# Patient Record
Sex: Female | Born: 1995 | Race: Asian | Marital: Married | Smoking: Never smoker
Health system: Southern US, Community
[De-identification: ages and names within clinical notes are randomized; demographics above are authoritative.]

## PROBLEM LIST (undated history)

## (undated) ENCOUNTER — Ambulatory Visit: Admission: EM | Source: Home / Self Care

---

## 2015-08-10 ENCOUNTER — Ambulatory Visit (INDEPENDENT_AMBULATORY_CARE_PROVIDER_SITE_OTHER): Payer: BLUE CROSS/BLUE SHIELD

## 2015-08-10 ENCOUNTER — Ambulatory Visit (INDEPENDENT_AMBULATORY_CARE_PROVIDER_SITE_OTHER): Payer: BLUE CROSS/BLUE SHIELD | Admitting: Emergency Medicine

## 2015-08-10 ENCOUNTER — Encounter (HOSPITAL_BASED_OUTPATIENT_CLINIC_OR_DEPARTMENT_OTHER): Payer: Self-pay

## 2015-08-10 ENCOUNTER — Ambulatory Visit (HOSPITAL_BASED_OUTPATIENT_CLINIC_OR_DEPARTMENT_OTHER)
Admission: RE | Admit: 2015-08-10 | Discharge: 2015-08-10 | Disposition: A | Discharge status: Home / Self Care | Payer: BLUE CROSS/BLUE SHIELD | Source: Ambulatory Visit | Attending: Emergency Medicine | Admitting: Emergency Medicine

## 2015-08-10 VITALS — BP 114/68 | HR 91 | Temp 99.0°F | Resp 16 | Ht 61.0 in | Wt 134.0 lb

## 2015-08-10 DIAGNOSIS — R1310 Dysphagia, unspecified: Secondary | ICD-10-CM

## 2015-08-10 DIAGNOSIS — R109 Unspecified abdominal pain: Secondary | ICD-10-CM | POA: Diagnosis not present

## 2015-08-10 DIAGNOSIS — R1013 Epigastric pain: Secondary | ICD-10-CM

## 2015-08-10 DIAGNOSIS — D72829 Elevated white blood cell count, unspecified: Secondary | ICD-10-CM

## 2015-08-10 LAB — POCT URINALYSIS DIP (MANUAL ENTRY)
BILIRUBIN UA: NEGATIVE
GLUCOSE UA: NEGATIVE
Ketones, POC UA: NEGATIVE
Leukocytes, UA: NEGATIVE
NITRITE UA: NEGATIVE
Protein Ur, POC: NEGATIVE
Spec Grav, UA: 1.025
UROBILINOGEN UA: 1
pH, UA: 7

## 2015-08-10 LAB — COMPLETE METABOLIC PANEL WITH GFR
ALBUMIN: 4.3 g/dL (ref 3.6–5.1)
ALK PHOS: 102 U/L (ref 33–115)
ALT: 17 U/L (ref 6–29)
AST: 20 U/L (ref 10–30)
BILIRUBIN TOTAL: 0.6 mg/dL (ref 0.2–1.2)
BUN: 9 mg/dL (ref 7–25)
CO2: 24 mmol/L (ref 20–31)
CREATININE: 0.52 mg/dL (ref 0.50–1.10)
Calcium: 9.6 mg/dL (ref 8.6–10.2)
Chloride: 103 mmol/L (ref 98–110)
GFR, Est African American: >89 mL/min (ref >=60)
GFR, Est Non African American: >89 mL/min (ref >=60)
Glucose, Bld: 69 mg/dL (ref 65–99)
Potassium: 4.8 mmol/L (ref 3.5–5.3)
Sodium: 138 mmol/L (ref 135–146)
TOTAL PROTEIN: 7.4 g/dL (ref 6.1–8.1)

## 2015-08-10 LAB — POCT CBC
GRANULOCYTE PERCENT: 64 % (ref 37–80)
HCT, POC: 40.5 % (ref 37.7–47.9)
Hemoglobin: 14.4 g/dL (ref 12.2–16.2)
Lymph, poc: 3.2 (ref 0.6–3.4)
MCH: 30 pg (ref 27–31.2)
MCHC: 35.5 g/dL — AB (ref 31.8–35.4)
MCV: 84.4 fL (ref 80–97)
MID (CBC): 0.9 (ref 0–0.9)
MPV: 8.7 fL (ref 0–99.8)
PLATELET COUNT, POC: 268 10*3/uL (ref 142–424)
POC Granulocyte: 7.3 — AB (ref 2–6.9)
POC LYMPH %: 28 % (ref 10–50)
POC MID %: 8 % (ref 0–12)
RBC: 4.79 M/uL (ref 4.04–5.48)
RDW, POC: 13.3 %
WBC: 11.4 10*3/uL — AB (ref 4.6–10.2)

## 2015-08-10 LAB — POCT URINE PREGNANCY: PREG TEST UR: NEGATIVE

## 2015-08-10 MED ORDER — IOPAMIDOL (ISOVUE-300) INJECTION 61%
100.0000 mL | Freq: Once | INTRAVENOUS | Status: AC | PRN
  Administered 2015-08-10: 100 mL via INTRAVENOUS

## 2015-08-10 MED ORDER — OMEPRAZOLE 40 MG PO CPDR: 40.0000 mg | DELAYED_RELEASE_CAPSULE | Freq: Every day | ORAL | Status: DC

## 2015-08-10 NOTE — Progress Notes (Addendum)
By signing my name below, I, Raven Small, attest that this documentation has been prepared under the direction and in the presence of Lesle ChrisSteven Christopher Glasscock, MD.  Electronically Signed: Andrew Auaven Small, ED Scribe. 08/10/2015. 12:14 PM.  Chief Complaint:  Chief Complaint  Patient presents with  . Gastroesophageal Reflux    pt states that when she eats her stomach hurts, and feels like the food is just sitting in one area    HPI: Isabella Townsend is a 20 y.o. female who [facility] complaining of trouble eating for the past 3 days. Pt reports abdominal pain with eating, becoming full quickly and has the sensation that food is stuck in her esophagus, pointing to chest. She has same issue when she lived in [place] about 4[place]5 years ago. She states symptoms went away after having her son.  She has taken tylenol without relief to symptoms but has not tried zantac or other anti acid.  LMP was 1 week ago and last 3-4 days. Pt is on nexplanon. Pt denies weight loss, emesis, and vomiting blood.   Pt is from [place] and does not speak [place]. She had brought her sister in law to translate.    History reviewed. No pertinent past medical history. History reviewed. No pertinent past surgical history. Social History   Social History  . Marital Status: Married    Spouse Name: N/A  . Number of Children: N/A  . Years of Education: N/A   Social History Main Topics  . Smoking status: Never Smoker   . Smokeless tobacco: None  . Alcohol Use: None  . Drug Use: None  . Sexual Activity: Not Asked   Other Topics Concern  . None   Social History Narrative  . None   History reviewed. No pertinent family history. Allergies not on file Prior to Admission medications   Medication Sig Start Date End Date Taking? Authorizing Provider  etonogestrel (NEXPLANON) 68 MG IMPL implant 1 each by Subdermal route once.   Yes Historical Provider, MD     ROS: The patient denies fevers, chills, night sweats, unintentional  weight loss, chest pain, palpitations, wheezing, dyspnea on exertion, nausea, vomiting,  dysuria, hematuria, melena, numbness, weakness, or tingling.   All other systems have been reviewed and were otherwise negative with the exception of those mentioned in the HPI and as above.    PHYSICAL EXAM: Filed Vitals:   08/10/15 1205  BP: 114/68  Pulse: 91  Temp: 99 F (37.2 C)  Resp: 16   Body mass index is 25.33 kg/(m^2).   General: Alert, no acute distress HEENT:  Normocephalic, atraumatic, oropharynx patent. [facility] Cardiovascular:  Regular rate and rhythm, no rubs murmurs or gallops.  No Carotid bruits, radial pulse intact. No pedal edema.  Respiratory: Clear to auscultation bilaterally.  No wheezes, rales, or rhonchi.  No cyanosis, no use of accessory musculature Abdominal: No organomegaly, abdomen is soft, positive bowel sounds.  No masses. Very mild tenderness mid epigastrium. No rebound no guarding.  Musculoskeletal: Gait intact. No edema, tenderness Skin: No rashes. Neurologic: Facial musculature symmetric. Psychiatric: Patient acts appropriately throughout our interaction. Lymphatic: No cervical or submandibular lymphadenopathy   LABS: Results for orders placed or performed in visit on 08/10/15  POCT CBC  Result Value Ref Range   WBC 11.4 (A) 4.6 - 10.2 K/uL   Lymph, poc 3.2 0.6 - 3.4   POC LYMPH PERCENT 28.0 10 - 50 %L   MID (cbc) 0.9 0 - 0.9  POC MID % 8.0 0 - 12 %M   POC Granulocyte 7.3 (A) 2 - 6.9   Granulocyte percent 64.0 37 - 80 %G   RBC 4.79 4.04 - 5.48 M/uL   Hemoglobin 14.4 12.2 - 16.2 g/dL   HCT, POC 16.1 09.6 - 47.9 %   MCV 84.4 80 - 97 fL   MCH, POC 30.0 27 - 31.2 pg   MCHC 35.5 (A) 31.8 - 35.4 g/dL   RDW, POC 04.5 %   Platelet Count, POC 268 142 - 424 K/uL   MPV 8.7 0 - 99.8 fL  POCT urinalysis dipstick  Result Value Ref Range   Color, UA yellow yellow   Clarity, UA clear clear   Glucose, UA negative negative   Bilirubin, UA negative  negative   Ketones, POC UA negative negative   Spec Grav, UA 1.025    Blood, UA trace-intact (A) negative   pH, UA 7.0    Protein Ur, POC negative negative   Urobilinogen, UA 1.0    Nitrite, UA Negative Negative   Leukocytes, UA Negative Negative  POCT urine pregnancy  Result Value Ref Range   Preg Test, Ur Negative Negative     EKG/XRAY:   Primary read interpreted by Dr. Cleta Alberts at [facility].  Dg Abd Acute W/chest  08/10/2015  CLINICAL DATA:  Dysphagia and abdominal pain EXAM: DG ABDOMEN ACUTE W/ 1V CHEST COMPARISON:  None. FINDINGS: There is no evidence of dilated bowel loops or free intraperitoneal air. No radiopaque calculi or other significant radiographic abnormality is seen. Heart size and mediastinal contours are within normal limits. Both lungs are clear. IMPRESSION: Negative abdominal radiographs.  No acute cardiopulmonary disease. Electronically Signed   By: Marnee Spring M.D.   On: 08/10/2015 13:37    ASSESSMENT/PLAN: She is here with her sister-in-law who is a good interpreter but I'm still having some difficulty with the history. She does have abdominal tenderness with an elevated white count. Not clear what is going on with her. She is not pregnant and hasn't On. Seed with CT abdomen pelvis with contrast.I personally performed the services described in this documentation, which was scribed in my presence. The recorded information has been reviewed and is accurate.I did go ahead and send in Prilosec 20 mg for her stomach.I Called [facility] [place] and spoke to At[place] in radiology. I told him we did not check on allergies when the patient was here. He agreed to ask her about allergies if there is a problem he will call me on my cell phone. If she has any allergies will change to an oral contrast only I personally performed the services described in this documentation, which was scribed in my presence. The recorded information has been reviewed and is accurate.I was able to speak with  the interpreter and she did tell me the patient did not have any allergies.   Gross sideeffects, risk and benefits, and alternatives of medications d/w patient. Patient is aware that all medications have potential sideeffects and we are unable to predict every sideeffect or drug-drug interaction that may occur.  Lesle Chris MD 08/10/2015 12:14 PM

## 2015-08-10 NOTE — Patient Instructions (Addendum)
     IF you received an x-ray today, you will receive an invoice from [facility]. Please contact [facility] at [phone] with questions or concerns regarding your invoice.   IF you received labwork today, you will receive an invoice from [facility]. Please contact [facility] at [phone] with questions or concerns regarding your invoice.   Our billing staff will not be able to assist you with questions regarding bills from these companies.  You will be contacted with the lab results as soon as they are available. The fastest way to get your results is to activate your My Chart account. Instructions are located on the last page of this paperwork. If you have not heard from us regarding the results in 2 weeks, please contact this office.    Patient is to do directly to [facility]. Patient is going there to have CT scan. Patient to check in at the front desk.   [address], [place], [place] [address]

## 2015-08-11 ENCOUNTER — Telehealth: Payer: Self-pay

## 2015-08-11 NOTE — Telephone Encounter (Signed)
-----   Message from Collene GobbleSteven A Daub, MD sent at 08/11/2015 10:26 AM EDT ----- I called and gave results to her sister-in-law who speaks [place]

## 2015-08-11 NOTE — Telephone Encounter (Signed)
Dr, Cleta Albertsaub noted he called lab results and gave results to her sister-in-law who speaks [place]

## 2015-08-13 LAB — H. PYLORI BREATH TEST: H. PYLORI BREATH TEST: NOT DETECTED

## 2016-07-18 DIAGNOSIS — Z6828 Body mass index (BMI) 28.0-28.9, adult: Secondary | ICD-10-CM | POA: Diagnosis not present

## 2016-07-18 DIAGNOSIS — N898 Other specified noninflammatory disorders of vagina: Secondary | ICD-10-CM | POA: Diagnosis not present

## 2016-07-18 DIAGNOSIS — Z124 Encounter for screening for malignant neoplasm of cervix: Secondary | ICD-10-CM | POA: Diagnosis not present

## 2016-07-18 DIAGNOSIS — Z01419 Encounter for gynecological examination (general) (routine) without abnormal findings: Secondary | ICD-10-CM | POA: Diagnosis not present

## 2016-10-29 ENCOUNTER — Ambulatory Visit (INDEPENDENT_AMBULATORY_CARE_PROVIDER_SITE_OTHER): Payer: BLUE CROSS/BLUE SHIELD | Admitting: Physician Assistant

## 2016-10-29 ENCOUNTER — Encounter: Payer: Self-pay | Admitting: Physician Assistant

## 2016-10-29 VITALS — BP 96/68 | HR 76 | Temp 99.0°F | Resp 16 | Ht 60.5 in | Wt 146.6 lb

## 2016-10-29 DIAGNOSIS — M25531 Pain in right wrist: Secondary | ICD-10-CM | POA: Diagnosis not present

## 2016-10-29 DIAGNOSIS — M25532 Pain in left wrist: Secondary | ICD-10-CM

## 2016-10-29 DIAGNOSIS — M7989 Other specified soft tissue disorders: Secondary | ICD-10-CM | POA: Diagnosis not present

## 2016-10-29 MED ORDER — PREDNISONE 20 MG PO TABS: ORAL_TABLET | ORAL | 0 refills | Status: DC

## 2016-10-29 NOTE — Patient Instructions (Addendum)
  I would like you to take your medication as prescribed. I will contact you about the results.  This will determine if you will need wrist splints versus foregoing this at the time.   You can try icing the area three times per day for 15 minutes.  Make sure there is a barrier between you and the ice.    IF you received an x-ray today, you will receive an invoice from [facility]. Please contact [facility] at [phone] with questions or concerns regarding your invoice.   IF you received labwork today, you will receive an invoice from [facility]. Please contact [facility] at [phone] with questions or concerns regarding your invoice.   Our billing staff will not be able to assist you with questions regarding bills from these companies.  You will be contacted with the lab results as soon as they are available. The fastest way to get your results is to activate your My Chart account. Instructions are located on the last page of this paperwork. If you have not heard from us regarding the results in 2 weeks, please contact this office.

## 2016-10-29 NOTE — Progress Notes (Signed)
[facility] AT [facility] [address], [place] [address] [phone]  Date:  10/29/2016   Name:  Isabella Townsend   DOB:  12/11/1995   MRN:  [id number]  PCP:  [facility]Gyn, [facility]    History of Present Illness:  Isabella Townsend is a 21 y.o. female patient who presents to PCP with No chief complaint on file.    Patient notes that she has hand swelling and pain.  She notes that she has had this in both hands.  Feels like pins and needles at times.  She is newly working with her hands at this time.  She has no family hx or personal hx of auto-immune illness/rheumatoid disease, however her family does not have healthcare in [place].  She has no other joint pain.  No fever.  No GI issues.     There are no active problems to display for this patient.   No past medical history on file.  No past surgical history on file.  Social History  Substance Use Topics  . Smoking status: Never Smoker  . Smokeless tobacco: Not on file  . Alcohol use Not on file    No family history on file.  No Known Allergies  Medication list has been reviewed and updated.  Current Outpatient Prescriptions on File Prior to Visit  Medication Sig Dispense Refill  . etonogestrel (NEXPLANON) 68 MG IMPL implant 1 each by Subdermal route once.    Marland Kitchen. omeprazole (PRILOSEC) 40 MG capsule Take 1 capsule (40 mg total) by mouth daily. 30 capsule 1   No current facility-administered medications on file prior to visit.     ROS ROS otherwise unremarkable unless listed above.  Physical Examination: There were no vitals taken for this visit. Ideal Body Weight:    Physical Exam  Constitutional: She is oriented to person, place, and time. She appears well-developed and well-nourished. No distress.  HENT:  Head: Normocephalic and atraumatic.  Right Ear: External ear normal.  Left Ear: External ear normal.  Eyes: Pupils are equal, round, and reactive to light. Conjunctivae and EOM are normal.  Cardiovascular: Normal  rate.   Pulmonary/Chest: Effort normal. No respiratory distress.  Musculoskeletal:  Normal strength.  No noticeable swelling. Good opposition. +phalen, -tinel  Neurological: She is alert and oriented to person, place, and time.  Skin: She is not diaphoretic.  Psychiatric: She has a normal mood and affect. Her behavior is normal.   Results for orders placed or performed in visit on 10/29/16  CBC  Result Value Ref Range   WBC 9.0 3.4 - 10.8 x10E3/uL   RBC 4.93 3.77 - 5.28 x10E6/uL   Hemoglobin 14.3 11.1 - 15.9 g/dL   Hematocrit 95.643.5 21.334.0 - 46.6 %   MCV 88 79 - 97 fL   MCH 29.0 26.6 - 33.0 pg   MCHC 32.9 31.5 - 35.7 g/dL   RDW 08.613.3 57.812.3 - 46.915.4 %   Platelets 334 150 - 379 x10E3/uL  Rheumatoid factor  Result Value Ref Range   Rhuematoid fact SerPl-aCnc <10.0 0.0 - 13.9 IU/mL  Sedimentation rate  Result Value Ref Range   Sed Rate 7 0 - 32 mm/hr     Assessment and Plan: Isabella Townsend is a 21 y.o. female who is here today for cc of hand swelling and pain Will test for possible rheumatoid etiology.  Possible carpal, wrist tendinitis.   She will ice the wrist, and use wrist splints at night.short taper of prednisone. Swelling of both hands - Plan:  CBC, Rheumatoid factor, Sedimentation rate, predniSONE (DELTASONE) 20 MG tablet  Trena PlattStephanie Milania Haubner, PA-C [facility] 8/2/20187:30 AM

## 2016-10-30 LAB — CBC
HEMATOCRIT: 43.5 % (ref 34.0–46.6)
Hemoglobin: 14.3 g/dL (ref 11.1–15.9)
MCH: 29 pg (ref 26.6–33.0)
MCHC: 32.9 g/dL (ref 31.5–35.7)
MCV: 88 fL (ref 79–97)
Platelets: 334 10*3/uL (ref 150–379)
RBC: 4.93 x10E6/uL (ref 3.77–5.28)
RDW: 13.3 % (ref 12.3–15.4)
WBC: 9 10*3/uL (ref 3.4–10.8)

## 2016-10-30 LAB — RHEUMATOID FACTOR: Rhuematoid fact SerPl-aCnc: <10 IU/mL (ref 0.0–13.9)

## 2016-10-30 LAB — SEDIMENTATION RATE: Sed Rate: 7 mm/hr (ref 0–32)

## 2016-11-11 ENCOUNTER — Ambulatory Visit (INDEPENDENT_AMBULATORY_CARE_PROVIDER_SITE_OTHER): Payer: BLUE CROSS/BLUE SHIELD | Admitting: Physician Assistant

## 2016-11-11 ENCOUNTER — Encounter: Payer: Self-pay | Admitting: Physician Assistant

## 2016-11-11 VITALS — BP 117/79 | HR 98 | Temp 99.4°F | Resp 18 | Ht 60.5 in | Wt 146.6 lb

## 2016-11-11 DIAGNOSIS — G5603 Carpal tunnel syndrome, bilateral upper limbs: Secondary | ICD-10-CM | POA: Diagnosis not present

## 2016-11-11 MED ORDER — MELOXICAM 15 MG PO TABS: 15.0000 mg | ORAL_TABLET | Freq: Every day | ORAL | 0 refills | Status: DC

## 2016-11-11 NOTE — Patient Instructions (Addendum)
I am giving you meloxicam for your wrist pain.  Please do NOT take this with naproxen or ibuprofen.   I want you to wear the wrist splints at night.  You may talk it off during the day, or during your work hours.  You are able to ice the wrist.  If you continue to have the pain and tingling in 30 days, then let me know so that I may make a referral.    Carpal Tunnel Syndrome Carpal tunnel syndrome is a condition that causes pain in your hand and arm. The carpal tunnel is a narrow area that is on the palm side of your wrist. Repeated wrist motion or certain diseases may cause swelling in the tunnel. This swelling can pinch the main nerve in the wrist (median nerve). Follow these instructions at home: If you have a splint:  Wear it as told by your doctor. Remove it only as told by your doctor.  Loosen the splint if your fingers: ? Become numb and tingle. ? Turn blue and cold.  Keep the splint clean and dry. General instructions  Take over-the-counter and prescription medicines only as told by your doctor.  Rest your wrist from any activity that may be causing your pain. If needed, talk to your employer about changes that can be made in your work, such as getting a wrist pad to use while typing.  If directed, apply ice to the painful area: ? Put ice in a plastic bag. ? Place a towel between your skin and the bag. ? Leave the ice on for 20 minutes, 2-3 times per day.  Keep all follow-up visits as told by your doctor. This is important.  Do any exercises as told by your doctor, physical therapist, or occupational therapist. Contact a doctor if:  You have new symptoms.  Medicine does not help your pain.  Your symptoms get worse. This information is not intended to replace advice given to you by your health care provider. Make sure you discuss any questions you have with your health care provider. Document Released: 03/13/2011 Document Revised: 08/30/2015 Document Reviewed:  08/09/2014 Elsevier Interactive Patient Education  2018 [employer]     IF you received an x-ray today, you will receive an invoice from [facility]. Please contact [facility] at [phone] with questions or concerns regarding your invoice.   IF you received labwork today, you will receive an invoice from [facility]. Please contact [facility] at [phone] with questions or concerns regarding your invoice.   Our billing staff will not be able to assist you with questions regarding bills from these companies.  You will be contacted with the lab results as soon as they are available. The fastest way to get your results is to activate your My Chart account. Instructions are located on the last page of this paperwork. If you have not heard from us regarding the results in 2 weeks, please contact this office.

## 2016-11-11 NOTE — Progress Notes (Signed)
[facility] [address], [place] [address] [phone]  Date:  11/11/2016   Name:  Isabella Townsend   DOB:  11/27/1995   MRN:  [id number]  PCP:  [facility]yn, [facility]    History of Present Illness:  Isabella Townsend is a 21 y.o. female patient who presents to PCP with  Chief Complaint  Patient presents with  . Hand Pain    per pt states that after meds wears off the pain returns. No swelling currently.  . Follow-up     Patient reports that the pain had eased up with the use of prednisone, but returned soon after she stopped the prednisone.  She continues to have the pain and tingling in her wrist to her hands.  She is not using any splints.   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  There are no active problems to display for this patient.   No past medical history on file.  No past surgical history on file.  Social History  Substance Use Topics  . Smoking status: Never Smoker  . Smokeless tobacco: Never Used  . Alcohol use No    No family history on file.  No Known Allergies  Medication list has been reviewed and updated.  Current Outpatient Prescriptions on File Prior to Visit  Medication Sig Dispense Refill  . etonogestrel (NEXPLANON) 68 MG IMPL implant 1 each by Subdermal route once.    Marland Kitchen. omeprazole (PRILOSEC) 40 MG capsule Take 1 capsule (40 mg total) by mouth daily. (Patient not taking: Reported on 10/29/2016) 30 capsule 1  . predniSONE (DELTASONE) 20 MG tablet Take 3 PO QAM x3days, 2 PO QAM x3days, 1 PO QAM x3days (Patient not taking: Reported on 11/11/2016) 18 tablet 0   No current facility-administered medications on file prior to visit.     ROS ROS otherwise unremarkable unless listed above.  Physical Examination: BP 117/79 (BP Location: Right Arm, Patient Position: Sitting, Cuff Size: Normal)   Pulse 98   Temp 99.4 F (37.4 C) (Oral)   Resp 18   Ht 5' 0.5" (1.537 m)   Wt 146 lb 9.6 oz (66.5 kg)   SpO2 98%   BMI 28.16 kg/m  Ideal Body Weight:  Weight in (lb) to have BMI = 25: 129.9  Physical Exam  Constitutional: She is oriented to person, place, and time. She appears well-developed and well-nourished. No distress.  HENT:  Head: Normocephalic and atraumatic.  Right Ear: External ear normal.  Left Ear: External ear normal.  Eyes: Pupils are equal, round, and reactive to light. Conjunctivae and EOM are normal.  Cardiovascular: Normal rate.   Pulmonary/Chest: Effort normal. No respiratory distress.  Musculoskeletal:  +phalen  Neurological: She is alert and oriented to person, place, and time.  Skin: She is not diaphoretic.  Psychiatric: She has a normal mood and affect. Her behavior is normal.     Assessment and Plan: Isabella Townsend is a 21 y.o. female who is here today for cc of hand/wrist pain. Inflammatory and biomarkers are normal.   Likely carpal tunnel.  Given anti-inflammatory and precautions discussed. Advised wrist splints at night and at work when not working.  She will follow up within 1 month if she would like a referral with orthopedist. Bilateral carpal tunnel syndrome - Plan: meloxicam (MOBIC) 15 MG tablet  Trena PlattStephanie English, PA-C [facility] 8/7/20188:17 PM

## 2016-12-08 ENCOUNTER — Other Ambulatory Visit: Payer: Self-pay | Admitting: Physician Assistant

## 2016-12-08 DIAGNOSIS — G5603 Carpal tunnel syndrome, bilateral upper limbs: Secondary | ICD-10-CM

## 2016-12-10 ENCOUNTER — Telehealth: Payer: Self-pay | Admitting: Physician Assistant

## 2016-12-10 DIAGNOSIS — G5603 Carpal tunnel syndrome, bilateral upper limbs: Secondary | ICD-10-CM

## 2016-12-10 NOTE — Telephone Encounter (Signed)
Pt's sister-in-law called stating pt has decided she is ready for a referral for a bone doctor. Pt's sister-in-law said best number to reach her on is [phone]. Thanks!

## 2016-12-10 NOTE — Telephone Encounter (Signed)
I think they are referring to orthopedist.  I am placing a referral at this time.

## 2016-12-11 NOTE — Telephone Encounter (Signed)
Thanks so much. 

## 2016-12-11 NOTE — Telephone Encounter (Signed)
[facility] on 9/6. Thanks!

## 2017-01-19 DIAGNOSIS — G5602 Carpal tunnel syndrome, left upper limb: Secondary | ICD-10-CM | POA: Diagnosis not present

## 2017-01-19 DIAGNOSIS — G5601 Carpal tunnel syndrome, right upper limb: Secondary | ICD-10-CM | POA: Diagnosis not present

## 2017-02-16 DIAGNOSIS — G5602 Carpal tunnel syndrome, left upper limb: Secondary | ICD-10-CM | POA: Diagnosis not present

## 2017-02-16 DIAGNOSIS — G5601 Carpal tunnel syndrome, right upper limb: Secondary | ICD-10-CM | POA: Diagnosis not present

## 2017-03-03 DIAGNOSIS — G5602 Carpal tunnel syndrome, left upper limb: Secondary | ICD-10-CM | POA: Diagnosis not present

## 2017-03-03 DIAGNOSIS — G5601 Carpal tunnel syndrome, right upper limb: Secondary | ICD-10-CM | POA: Diagnosis not present

## 2017-07-03 IMAGING — CT CT ABD-PELV W/ CM
2 of 4 series · 16 of 46 positions shown, 18 images · IV contrast (APPLIED)
Comparison: None.

CLINICAL DATA: Four day history of abdominal pain with decreased
appetite and constipation

EXAM:
CT ABDOMEN AND PELVIS WITH CONTRAST
TECHNIQUE: Multidetector CT imaging of the abdomen and pelvis was performed
using the standard protocol following bolus administration of
intravenous contrast. Oral contrast was also administered.
CONTRAST:  100mL 547GXY-ENN IOPAMIDOL (547GXY-ENN) INJECTION 61%

[Series 2: axial st · axial · 0.81mm/px · z∈[-386,+44]mm · 13 of 94 slices shown, 15 images]
[im 4/94  soft-tissue]
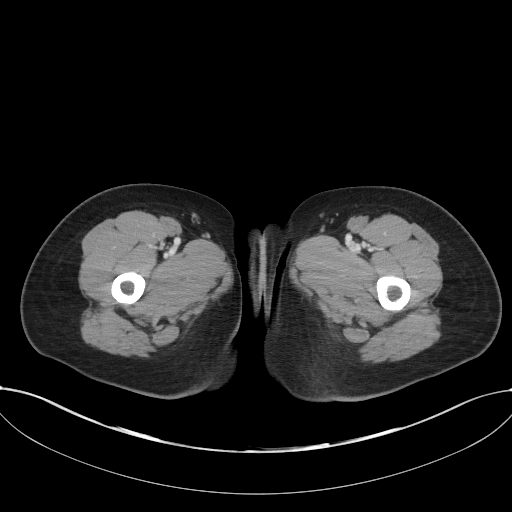
[im 4/94  bone]
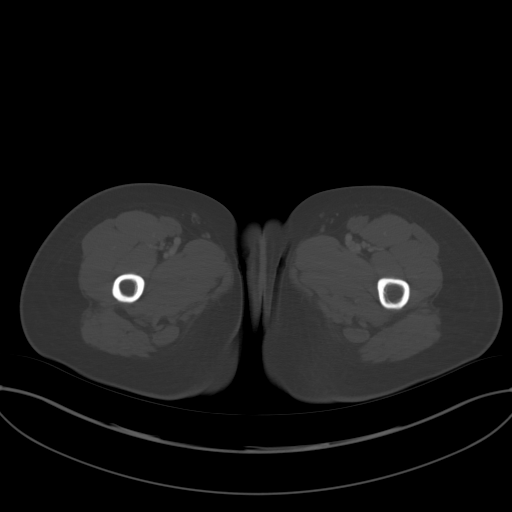
[im 12/94  soft-tissue]
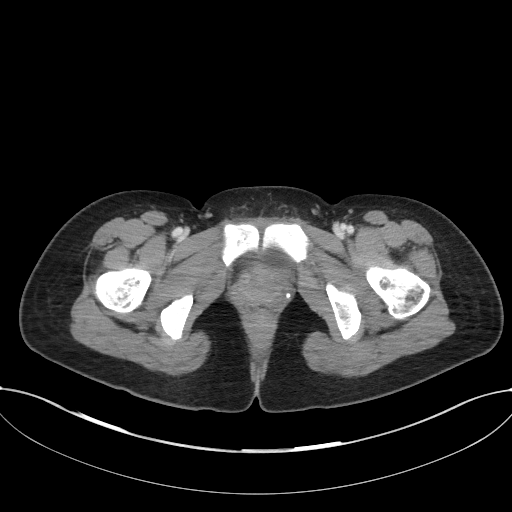
[im 19/94  soft-tissue]
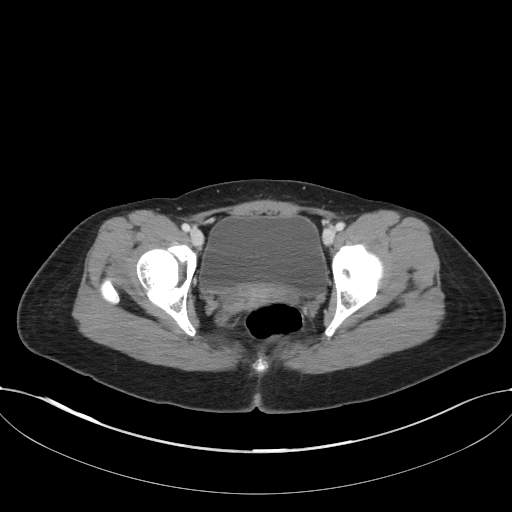
[im 27/94  soft-tissue]
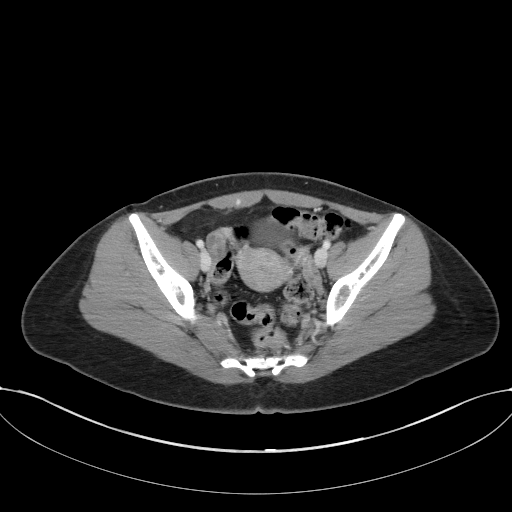
[im 34/94  soft-tissue]
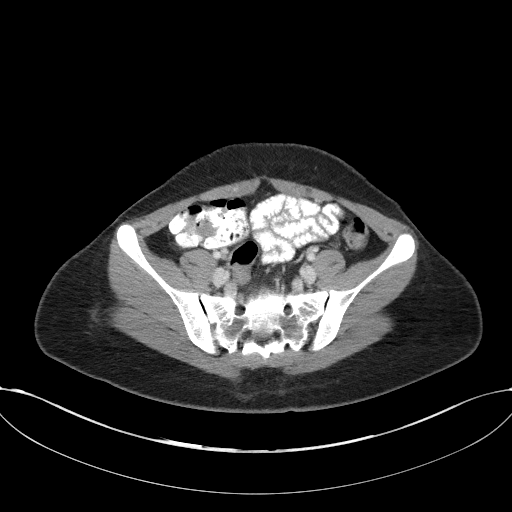
[im 41/94  soft-tissue]
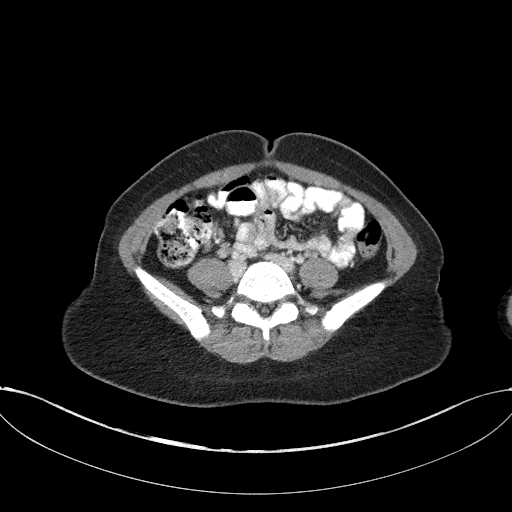
[im 49/94  soft-tissue]
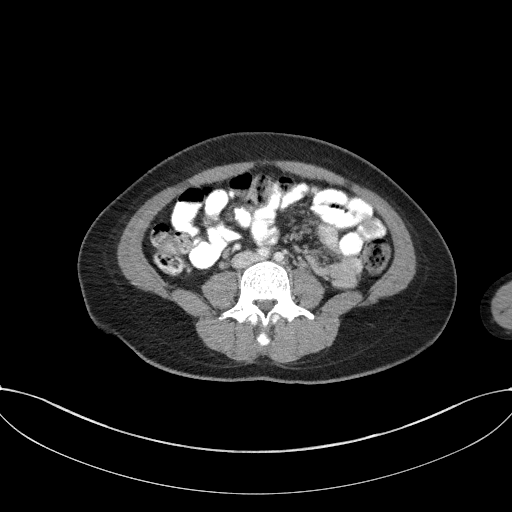
[im 53/94  soft-tissue]
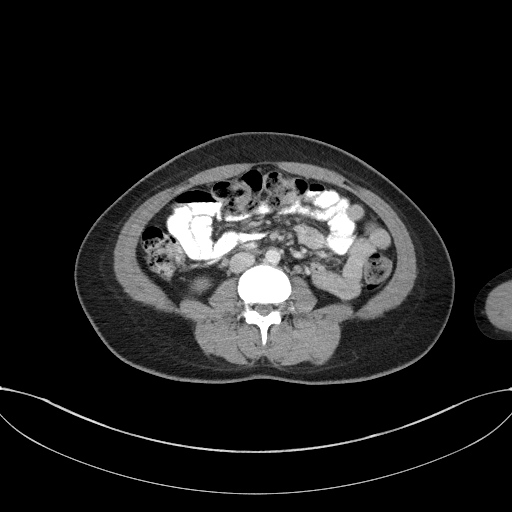
[im 60/94  soft-tissue]
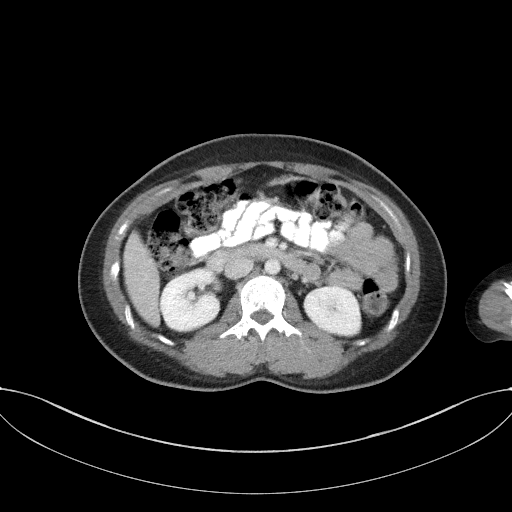
[im 60/94  bone]
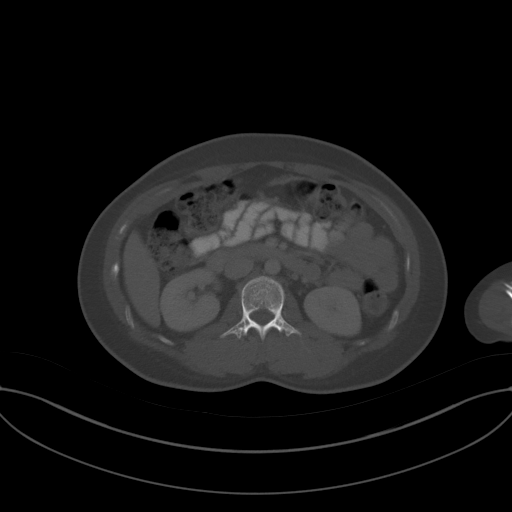
[im 67/94  soft-tissue]
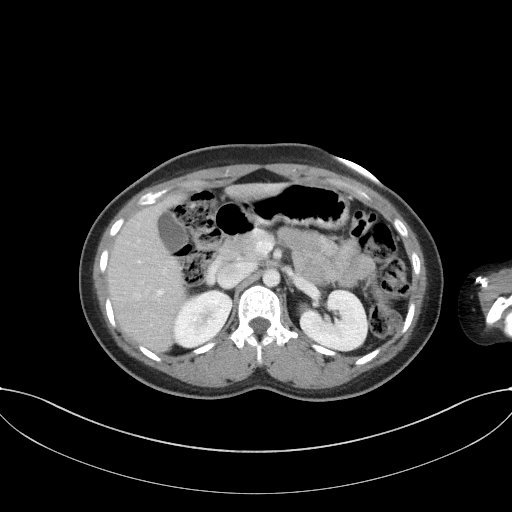
[im 75/94  soft-tissue]
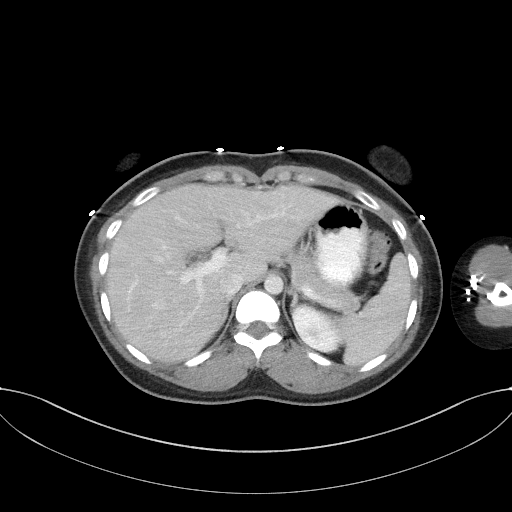
[im 82/94  soft-tissue]
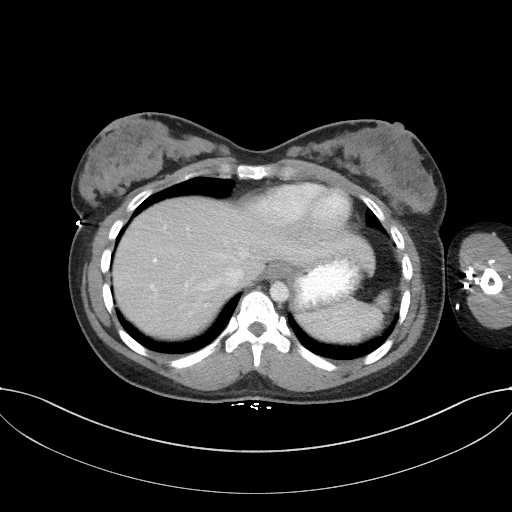
[im 90/94  soft-tissue]
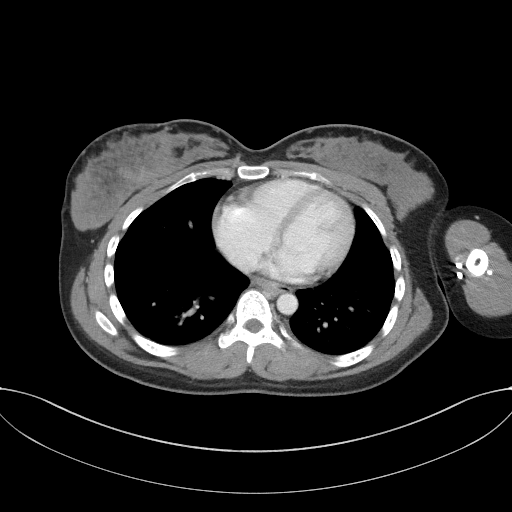

[Series 5: coronal st · coronal · 0.83mm/px · 3 of 69 slices shown]
[im 23/69  soft-tissue]
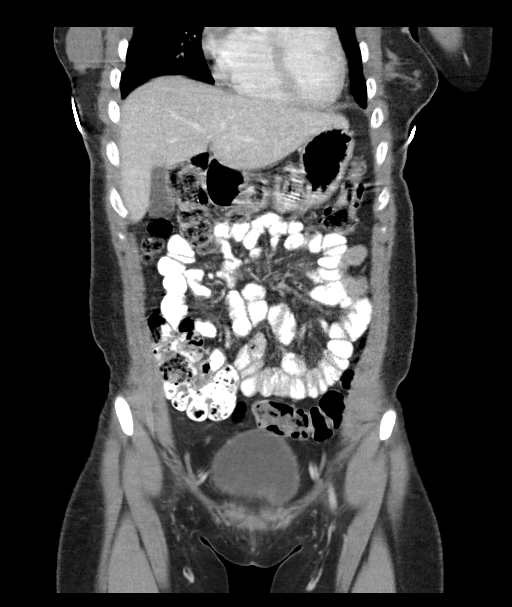
[im 31/69  soft-tissue]
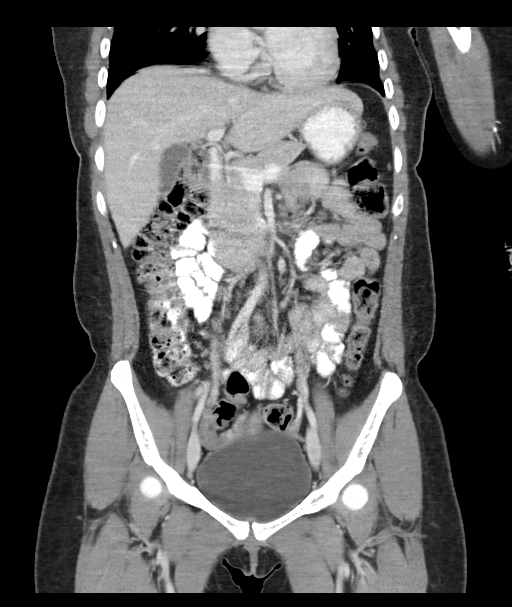
[im 38/69  soft-tissue]
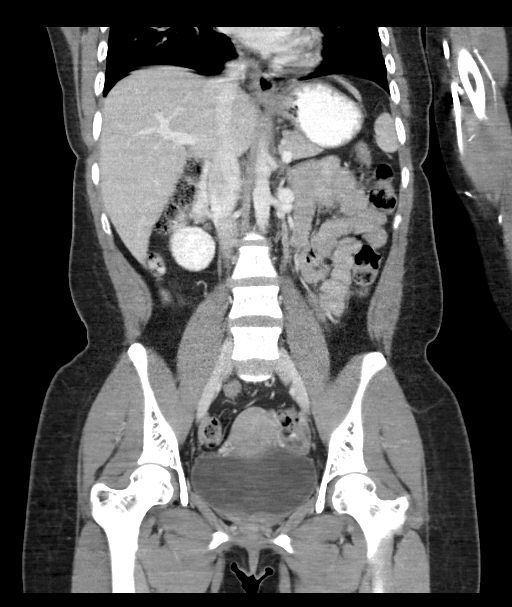

[16 of 46 positions shown; findings below may reference images not displayed]

FINDINGS: Lower chest:  Lung bases are clear.

Hepatobiliary: No focal liver lesions are identified. Gallbladder
wall is not appreciably thickened. There is no biliary duct
dilatation.

Pancreas: There is no demonstrable pancreatic mass or inflammatory
focus.

Spleen: No splenic lesions are evident.

Adrenals/Urinary Tract: Adrenals appear normal bilaterally. Kidneys
bilaterally show no appreciable mass or hydronephrosis on either
side. There is no renal or ureteral calculus on either side. Urinary
bladder is midline with wall thickness within normal limits.

Stomach/Bowel: There is no bowel wall or mesenteric thickening. No
bowel obstruction. No free air or portal venous air.

Vascular/Lymphatic: There is no abdominal aortic aneurysm. No
vascular lesions are evident on this study. There is no appreciable
adenopathy in the abdomen or pelvis by size criteria. Several small,
subcentimeter lymph nodes are noted in the right lower quadrant
region.

Reproductive: Uterus is anteverted. There is no pelvic mass or
pelvic fluid collection.

Other: Appendix appears unremarkable. There is no abscess or ascites
in the abdomen or pelvis.

Musculoskeletal: There are no blastic or lytic bone lesions. There
is no intramuscular or abdominal wall lesion.
IMPRESSION: There are several subcentimeter right lower quadrant lymph nodes. In
the appropriate clinical setting, these lymph nodes could indicate a
degree of mesenteric adenitis. There is no adenopathy by size
criteria in the abdomen or pelvis.

No bowel obstruction. No abscess. Appendix appears within normal
limits. No renal or ureteral calculus. No hydronephrosis. Urinary
bladder wall thickness is normal.

## 2017-07-07 ENCOUNTER — Encounter: Payer: Self-pay | Admitting: Physician Assistant

## 2017-07-27 DIAGNOSIS — Z113 Encounter for screening for infections with a predominantly sexual mode of transmission: Secondary | ICD-10-CM | POA: Diagnosis not present

## 2017-07-27 DIAGNOSIS — Z01419 Encounter for gynecological examination (general) (routine) without abnormal findings: Secondary | ICD-10-CM | POA: Diagnosis not present

## 2017-07-27 DIAGNOSIS — Z6828 Body mass index (BMI) 28.0-28.9, adult: Secondary | ICD-10-CM | POA: Diagnosis not present

## 2017-07-27 DIAGNOSIS — N925 Other specified irregular menstruation: Secondary | ICD-10-CM | POA: Diagnosis not present

## 2017-08-11 DIAGNOSIS — G5602 Carpal tunnel syndrome, left upper limb: Secondary | ICD-10-CM | POA: Diagnosis not present

## 2017-08-11 DIAGNOSIS — G5601 Carpal tunnel syndrome, right upper limb: Secondary | ICD-10-CM | POA: Diagnosis not present

## 2017-09-08 DIAGNOSIS — G5601 Carpal tunnel syndrome, right upper limb: Secondary | ICD-10-CM | POA: Diagnosis not present

## 2017-09-08 DIAGNOSIS — G5602 Carpal tunnel syndrome, left upper limb: Secondary | ICD-10-CM | POA: Diagnosis not present

## 2017-10-03 DIAGNOSIS — R05 Cough: Secondary | ICD-10-CM | POA: Diagnosis not present

## 2017-10-03 DIAGNOSIS — J019 Acute sinusitis, unspecified: Secondary | ICD-10-CM | POA: Diagnosis not present

## 2017-12-08 DIAGNOSIS — Z3046 Encounter for surveillance of implantable subdermal contraceptive: Secondary | ICD-10-CM | POA: Diagnosis not present

## 2017-12-08 DIAGNOSIS — Z309 Encounter for contraceptive management, unspecified: Secondary | ICD-10-CM | POA: Diagnosis not present

## 2018-01-05 DIAGNOSIS — G5602 Carpal tunnel syndrome, left upper limb: Secondary | ICD-10-CM | POA: Diagnosis not present

## 2018-01-05 DIAGNOSIS — G5601 Carpal tunnel syndrome, right upper limb: Secondary | ICD-10-CM | POA: Diagnosis not present

## 2018-02-01 DIAGNOSIS — G5601 Carpal tunnel syndrome, right upper limb: Secondary | ICD-10-CM | POA: Diagnosis not present

## 2018-02-12 DIAGNOSIS — G5601 Carpal tunnel syndrome, right upper limb: Secondary | ICD-10-CM | POA: Diagnosis not present

## 2018-04-07 HISTORY — PX: CARPAL TUNNEL RELEASE: SHX101

## 2018-04-14 DIAGNOSIS — G5602 Carpal tunnel syndrome, left upper limb: Secondary | ICD-10-CM | POA: Diagnosis not present

## 2018-04-29 DIAGNOSIS — G5602 Carpal tunnel syndrome, left upper limb: Secondary | ICD-10-CM | POA: Diagnosis not present

## 2018-09-27 DIAGNOSIS — Z6826 Body mass index (BMI) 26.0-26.9, adult: Secondary | ICD-10-CM | POA: Diagnosis not present

## 2018-09-27 DIAGNOSIS — L299 Pruritus, unspecified: Secondary | ICD-10-CM | POA: Diagnosis not present

## 2018-09-27 DIAGNOSIS — N926 Irregular menstruation, unspecified: Secondary | ICD-10-CM | POA: Diagnosis not present

## 2018-09-27 DIAGNOSIS — L259 Unspecified contact dermatitis, unspecified cause: Secondary | ICD-10-CM | POA: Diagnosis not present

## 2018-09-29 DIAGNOSIS — R21 Rash and other nonspecific skin eruption: Secondary | ICD-10-CM | POA: Diagnosis not present

## 2018-09-29 DIAGNOSIS — L259 Unspecified contact dermatitis, unspecified cause: Secondary | ICD-10-CM | POA: Diagnosis not present

## 2018-09-29 DIAGNOSIS — L299 Pruritus, unspecified: Secondary | ICD-10-CM | POA: Diagnosis not present

## 2018-10-08 DIAGNOSIS — Z7189 Other specified counseling: Secondary | ICD-10-CM | POA: Diagnosis not present

## 2018-10-08 DIAGNOSIS — R6889 Other general symptoms and signs: Secondary | ICD-10-CM | POA: Diagnosis not present

## 2018-10-14 ENCOUNTER — Ambulatory Visit
Admission: EM | Admit: 2018-10-14 | Discharge: 2018-10-14 | Disposition: A | Discharge status: Home / Self Care | Payer: BC Managed Care – PPO | Attending: Family Medicine | Admitting: Family Medicine

## 2018-10-14 ENCOUNTER — Other Ambulatory Visit: Payer: Self-pay

## 2018-10-14 ENCOUNTER — Encounter: Payer: Self-pay | Admitting: Emergency Medicine

## 2018-10-14 DIAGNOSIS — R21 Rash and other nonspecific skin eruption: Secondary | ICD-10-CM

## 2018-10-14 MED ORDER — PREDNISONE 50 MG PO TABS: 50.0000 mg | ORAL_TABLET | Freq: Every day | ORAL | 0 refills | Status: DC

## 2018-10-14 MED ORDER — CETIRIZINE HCL 10 MG PO CAPS: 10.0000 mg | ORAL_CAPSULE | Freq: Every day | ORAL | 0 refills | Status: DC

## 2018-10-14 MED ORDER — TRIAMCINOLONE ACETONIDE 0.1 % EX CREA: 1.0000 "application " | TOPICAL_CREAM | Freq: Two times a day (BID) | CUTANEOUS | 0 refills | Status: DC

## 2018-10-14 NOTE — ED Provider Notes (Signed)
[name]    CSN: [id number] Arrival date & time: 10/14/18  1053      History   Chief Complaint Chief Complaint  Patient presents with  . Rash    HPI [place] interpretation via [employer] interpreter Isabella Townsend is a 23 y.o. female no significant past medical history presenting today for evaluation of a rash.  Patient states that she has had a rash off and on for the past month.  Initially related this to poison ivy as she believes she was stuck by something while working in the garden.  Afterward she developed a rash.  Has noticed it to her arms and legs.  Has associated itching, denies pain.  Denies drainage.  Denies fever chills or body aches.  Denies associated URI symptoms.  Denies close contacts with similar rash.  She has been using hydroxyzine without relief.  Denies any changes to soaps lotions detergents or other hygiene products recently.  Denies any new foods or medicines.  HPI  History reviewed. No pertinent past medical history.  There are no active problems to display for this patient.   History reviewed. No pertinent surgical history.  OB History   No obstetric history on file.      Home Medications    Prior to Admission medications   Medication Sig Start Date End Date Taking? Authorizing Provider  hydrOXYzine (ATARAX/VISTARIL) 25 MG tablet Take 25 mg by mouth 3 (three) times daily as needed.   Yes [provider]  Cetirizine HCl 10 MG CAPS Take 1 capsule (10 mg total) by mouth daily for 10 days. 10/14/18 10/24/18  Wieters, Hallie C, PA-C  etonogestrel (NEXPLANON) 68 MG IMPL implant 1 each by Subdermal route once.    [provider]  meloxicam (MOBIC) 15 MG tablet Take 1 tablet (15 mg total) by mouth daily. 11/11/16   Garnetta BuddyEnglish, Stephanie D, PA  predniSONE (DELTASONE) 50 MG tablet Take 1 tablet (50 mg total) by mouth daily with breakfast. 10/14/18   Wieters, Hallie C, PA-C  triamcinolone cream (KENALOG) 0.1 % Apply 1 application  topically 2 (two) times daily. 10/14/18   Wieters, Hallie C, PA-C  omeprazole (PRILOSEC) 40 MG capsule Take 1 capsule (40 mg total) by mouth daily. Patient not taking: Reported on 10/29/2016 08/10/15 10/14/18  Collene Gobbleaub, Steven A, MD    Family History History reviewed. No pertinent family history.  Social History Social History   Tobacco Use  . Smoking status: Never Smoker  . Smokeless tobacco: Never Used  Substance Use Topics  . Alcohol use: No    Alcohol/week: 0.0 standard drinks  . Drug use: No     Allergies   Patient has no known allergies.   Review of Systems Review of Systems  Constitutional: Negative for fatigue and fever.  HENT: Negative for mouth sores.   Eyes: Negative for visual disturbance.  Respiratory: Negative for shortness of breath.   Cardiovascular: Negative for chest pain.  Gastrointestinal: Negative for abdominal pain, nausea and vomiting.  Musculoskeletal: Negative for arthralgias and joint swelling.  Skin: Positive for color change and rash. Negative for wound.  Neurological: Negative for dizziness, weakness, light-headedness and headaches.     Physical Exam Triage Vital Signs ED Triage Vitals  Enc Vitals Group     BP 10/14/18 1100 135/90     Pulse Rate 10/14/18 1100 (!) 113     Resp 10/14/18 1100 18     Temp 10/14/18 1100 98.6 F (37 C)     Temp Source 10/14/18  1100 Oral     SpO2 10/14/18 1100 99 %     Weight --      Height --      Head Circumference --      Peak Flow --      Pain Score 10/14/18 1101 6     Pain Loc --      Pain Edu? --      Excl. in [place]? --    No data found.  Updated Vital Signs BP 135/90 (BP Location: Right Arm)   Pulse (!) 113   Temp 98.6 F (37 C) (Oral)   Resp 18   SpO2 99%   Visual Acuity Right Eye Distance:   Left Eye Distance:   Bilateral Distance:    Right Eye Near:   Left Eye Near:    Bilateral Near:     Physical Exam Vitals signs and nursing note reviewed.  Constitutional:      General: She is not in  acute distress.    Appearance: She is well-developed.  HENT:     Head: Normocephalic and atraumatic.     Mouth/Throat:     Comments: Oral mucosa pink and moist, no tonsillar enlargement or exudate. Posterior pharynx patent and nonerythematous, no uvula deviation or swelling. Normal phonation.  No lesions on oral mucosa Eyes:     Conjunctiva/sclera: Conjunctivae normal.  Neck:     Musculoskeletal: Neck supple.  Cardiovascular:     Rate and Rhythm: Normal rate and regular rhythm.     Heart sounds: No murmur.  Pulmonary:     Effort: Pulmonary effort is normal. No respiratory distress.     Breath sounds: Normal breath sounds.     Comments: Breathing comfortably at rest, CTABL, no wheezing, rales or other adventitious sounds auscultated Abdominal:     Palpations: Abdomen is soft.     Tenderness: There is no abdominal tenderness.  Skin:    General: Skin is warm and dry.     Comments: Extremities with various areas of hyperpigmented healed areas as well as various erythematous slightly raised areas with evidence of excoriation.  No lesions noted to palms.  Neurological:     Mental Status: She is alert.      UC Treatments / Results  Labs (all labs ordered are listed, but only abnormal results are displayed) Labs Reviewed - No data to display  EKG   Radiology No results found.  Procedures Procedures (including critical care time)  Medications Ordered in UC Medications - No data to display  Initial Impression / Assessment and Plan / UC Course  I have reviewed the triage vital signs and the nursing notes.  Pertinent labs & imaging results that were available during my care of the patient were reviewed by me and considered in my medical decision making (see chart for details).     Rash with unclear etiology.  Persistence x1 month, has not been on steroids yet, rash seems more allergic/contact dermatitis versus infectious or fungal.  Do not suspect scabies at this time  although would consider treatment if symptoms persisting.  Will treat with course of prednisone x5 days, cetirizine as well as topical Kenalog to use as needed in areas of significant itching.  Continue to monitor,Discussed strict return precautions. Patient verbalized understanding and is agreeable with plan.  Final Clinical Impressions(s) / UC Diagnoses   Final diagnoses:  Rash and nonspecific skin eruption     Discharge Instructions     Begin prednisone daily for 5 days with  food in the morning Daily cetirizine May use kenalog cream twice daily in areas of significant itching, only use thin amount  Follow up if rash not resolving or worsening   ED Prescriptions    Medication Sig Dispense Auth. Provider   predniSONE (DELTASONE) 50 MG tablet Take 1 tablet (50 mg total) by mouth daily with breakfast. 5 tablet Wieters, Hallie C, PA-C   Cetirizine HCl 10 MG CAPS Take 1 capsule (10 mg total) by mouth daily for 10 days. 10 capsule Wieters, Hallie C, PA-C   triamcinolone cream (KENALOG) 0.1 % Apply 1 application topically 2 (two) times daily. 45 g Wieters, Watch HillHallie C, PA-C     Controlled Substance Prescriptions [place] Controlled Substance Registry consulted? Not Applicable   Lew DawesWieters, Hallie C, [place]-C 10/14/18 1136

## 2018-10-14 NOTE — ED Notes (Signed)
Patient able to ambulate independently  

## 2018-10-14 NOTE — ED Triage Notes (Addendum)
Pt presents to [facility] for assessment rash to chest, thighs, and back x 1 month.  Pt seen by PCP and was prescribed hydroxyzine.  Pt states being in the heat makes it worse. Interpreter used for triage.

## 2018-10-14 NOTE — Discharge Instructions (Signed)
Begin prednisone daily for 5 days with food in the morning Daily cetirizine May use kenalog cream twice daily in areas of significant itching, only use thin amount  Follow up if rash not resolving or worsening

## 2018-11-11 DIAGNOSIS — Z3049 Encounter for surveillance of other contraceptives: Secondary | ICD-10-CM | POA: Diagnosis not present

## 2018-11-15 DIAGNOSIS — Z4801 Encounter for change or removal of surgical wound dressing: Secondary | ICD-10-CM | POA: Diagnosis not present

## 2018-12-21 DIAGNOSIS — Z01419 Encounter for gynecological examination (general) (routine) without abnormal findings: Secondary | ICD-10-CM | POA: Diagnosis not present

## 2018-12-21 DIAGNOSIS — Z6830 Body mass index (BMI) 30.0-30.9, adult: Secondary | ICD-10-CM | POA: Diagnosis not present

## 2018-12-21 DIAGNOSIS — Z3040 Encounter for surveillance of contraceptives, unspecified: Secondary | ICD-10-CM | POA: Diagnosis not present

## 2019-07-22 ENCOUNTER — Ambulatory Visit
Admission: EM | Admit: 2019-07-22 | Discharge: 2019-07-22 | Disposition: A | Discharge status: Home / Self Care | Payer: BC Managed Care – PPO | Attending: Physician Assistant | Admitting: Physician Assistant

## 2019-07-22 DIAGNOSIS — R21 Rash and other nonspecific skin eruption: Secondary | ICD-10-CM | POA: Diagnosis not present

## 2019-07-22 MED ORDER — PREDNISONE 50 MG PO TABS: 50.0000 mg | ORAL_TABLET | Freq: Every day | ORAL | 0 refills | Status: DC

## 2019-07-22 MED ORDER — CETIRIZINE HCL 10 MG PO TABS: 10.0000 mg | ORAL_TABLET | Freq: Every day | ORAL | 0 refills | Status: DC

## 2019-07-22 MED ORDER — TRIAMCINOLONE ACETONIDE 0.1 % EX CREA: 1.0000 "application " | TOPICAL_CREAM | Freq: Two times a day (BID) | CUTANEOUS | 0 refills | Status: DC

## 2019-07-22 NOTE — ED Triage Notes (Signed)
Pt c/o poison ivy to rt arm, lt upper inner thigh and on lt side of abdomen x4 days

## 2019-07-22 NOTE — Discharge Instructions (Signed)
Start prednisone and zyrtec as directed. Triamcinolone cream as needed. Ice compress as needed. If showing signs of infection such as fever, pain, redness, warmth, follow up for reevaluation needed.

## 2019-07-22 NOTE — ED Provider Notes (Signed)
[name]    CSN: [id number] Arrival date & time: 07/22/19  1222      History   Chief Complaint Chief Complaint  Patient presents with  . Poison Ivy    HPI Isabella Townsend is a 24 y.o. female.   24 year old female comes in for 4 day history of rash. States had similar symptoms last year and was diagnosed with poison ivy dermatitis. Rash first started to the left forearm with itching. It has now spread to the left abdomen, thighs. Denies spreading erythema, warmth. Denies pain. Denies fever, chills, body aches. Has not tried anything for the symptoms.      History reviewed. No pertinent past medical history.  There are no problems to display for this patient.   History reviewed. No pertinent surgical history.  OB History   No obstetric history on file.      Home Medications    Prior to Admission medications   Medication Sig Start Date End Date Taking? Authorizing Provider  cetirizine (ZYRTEC ALLERGY) 10 MG tablet Take 1 tablet (10 mg total) by mouth daily. 07/22/19   Ok Edwards, PA-C  etonogestrel (NEXPLANON) 68 MG IMPL implant 1 each by Subdermal route once.    [provider]  predniSONE (DELTASONE) 50 MG tablet Take 1 tablet (50 mg total) by mouth daily with breakfast. 07/22/19   Tasia Catchings, Wyeth Hoffer V, PA-C  triamcinolone cream (KENALOG) 0.1 % Apply 1 application topically 2 (two) times daily. 07/22/19   Tasia Catchings, Monte Bronder V, PA-C  omeprazole (PRILOSEC) 40 MG capsule Take 1 capsule (40 mg total) by mouth daily. Patient not taking: Reported on 10/29/2016 08/10/15 10/14/18  Darlyne Russian, MD    Family History History reviewed. No pertinent family history.  Social History Social History   Tobacco Use  . Smoking status: Never Smoker  . Smokeless tobacco: Never Used  Substance Use Topics  . Alcohol use: No    Alcohol/week: 0.0 standard drinks  . Drug use: No     Allergies   Patient has no known allergies.   Review of Systems Review of Systems  Reason unable to  perform ROS: See HPI as above.     Physical Exam Triage Vital Signs ED Triage Vitals  Enc Vitals Group     BP 07/22/19 1226 140/84     Pulse Rate 07/22/19 1226 90     Resp 07/22/19 1226 20     Temp 07/22/19 1226 98.6 F (37 C)     Temp Source 07/22/19 1226 Oral     SpO2 07/22/19 1226 98 %     Weight --      Height --      Head Circumference --      Peak Flow --      Pain Score 07/22/19 1235 0     Pain Loc --      Pain Edu? --      Excl. in [place]? --    No data found.  Updated Vital Signs BP 140/84 (BP Location: Left Arm)   Pulse 90   Temp 98.6 F (37 C) (Oral)   Resp 20   LMP 07/03/2019   SpO2 98%   Physical Exam Constitutional:      General: She is not in acute distress.    Appearance: Normal appearance. She is well-developed. She is not toxic-appearing or diaphoretic.  HENT:     Head: Normocephalic and atraumatic.  Eyes:     Conjunctiva/sclera: Conjunctivae normal.  Pupils: Pupils are equal, round, and reactive to light.  Pulmonary:     Effort: Pulmonary effort is normal. No respiratory distress.     Comments: Speaking in full sentences without difficulty Musculoskeletal:     Cervical back: Normal range of motion and neck supple.  Skin:    General: Skin is warm and dry.     Comments: Maculopapular rash to the right forearm, left abdomen. No erythema, warmth. No tenderness to palpation.   Neurological:     Mental Status: She is alert and oriented to person, place, and time.      UC Treatments / Results  Labs (all labs ordered are listed, but only abnormal results are displayed) Labs Reviewed - No data to display  EKG   Radiology No results found.  Procedures Procedures (including critical care time)  Medications Ordered in UC Medications - No data to display  Initial Impression / Assessment and Plan / UC Course  I have reviewed the triage vital signs and the nursing notes.  Pertinent labs & imaging results that were available during my  care of the patient were reviewed by me and considered in my medical decision making (see chart for details).    Patient has not been outdoors, no contact with poison ivy. Discussed this may not be due to poison ivy, but exam consistent with contact dermatitis. No obvious new hygiene product change. Will provide symptomatic management with prednisone and zyrtec. Return precautions given.   Final Clinical Impressions(s) / UC Diagnoses   Final diagnoses:  Rash    ED Prescriptions    Medication Sig Dispense Auth. Provider   predniSONE (DELTASONE) 50 MG tablet Take 1 tablet (50 mg total) by mouth daily with breakfast. 5 tablet Read Bonelli V, PA-C   triamcinolone cream (KENALOG) 0.1 % Apply 1 application topically 2 (two) times daily. 30 g Andri Prestia V, PA-C   cetirizine (ZYRTEC ALLERGY) 10 MG tablet Take 1 tablet (10 mg total) by mouth daily. 15 tablet Belinda Fisher, PA-C     PDMP not reviewed this encounter.   Belinda Fisher, PA-C 07/22/19 1311

## 2020-04-06 ENCOUNTER — Other Ambulatory Visit: Payer: Self-pay

## 2020-04-06 ENCOUNTER — Ambulatory Visit
Admission: EM | Admit: 2020-04-06 | Discharge: 2020-04-06 | Disposition: A | Discharge status: Home / Self Care | Payer: BC Managed Care – PPO | Attending: Emergency Medicine | Admitting: Emergency Medicine

## 2020-04-06 DIAGNOSIS — J069 Acute upper respiratory infection, unspecified: Secondary | ICD-10-CM

## 2020-04-06 MED ORDER — CETIRIZINE HCL 10 MG PO TABS: 10.0000 mg | ORAL_TABLET | Freq: Every day | ORAL | 0 refills | Status: DC

## 2020-04-06 MED ORDER — FLUTICASONE PROPIONATE 50 MCG/ACT NA SUSP: 1.0000 | Freq: Every day | NASAL | 0 refills | Status: DC

## 2020-04-06 MED ORDER — PREDNISONE 20 MG PO TABS: 20.0000 mg | ORAL_TABLET | Freq: Every day | ORAL | 0 refills | Status: DC

## 2020-04-06 MED ORDER — BENZONATATE 100 MG PO CAPS: 100.0000 mg | ORAL_CAPSULE | Freq: Three times a day (TID) | ORAL | 0 refills | Status: DC

## 2020-04-06 NOTE — ED Triage Notes (Signed)
Pt present cough and and nasal congestion and sore throat.  Symptoms started two weeks ago. Pt states tested negative for covid on 04/04/20. Pt tried otc medication with no relief.

## 2020-04-06 NOTE — Discharge Instructions (Addendum)

## 2020-04-06 NOTE — ED Provider Notes (Signed)
[name]    CSN: [id number] Arrival date & time: 04/06/20  1039      History   Chief Complaint Chief Complaint  Patient presents with  . Cough  . Nasal Congestion    HPI Isabella Townsend is a 24 y.o. female  Presenting for URI symptoms including cough, nasal congestion, sore throat for the last 2 weeks.  Underwent Covid testing 2 days ago: Got her results back today-negative.  Has taken OTC medications without relief.  Most bothersome is cough which keeps her up at night.  History reviewed. No pertinent past medical history.  There are no problems to display for this patient.   History reviewed. No pertinent surgical history.  OB History   No obstetric history on file.      Home Medications    Prior to Admission medications   Medication Sig Start Date End Date Taking? Authorizing Provider  benzonatate (TESSALON) 100 MG capsule Take 1 capsule (100 mg total) by mouth every 8 (eight) hours. 04/06/20  Yes Hall-Potvin, [place], PA-C  fluticasone (FLONASE) 50 MCG/ACT nasal spray Place 1 spray into both nostrils daily. 04/06/20  Yes Hall-Potvin, [place], PA-C  predniSONE (DELTASONE) 20 MG tablet Take 1 tablet (20 mg total) by mouth daily. 04/06/20  Yes Hall-Potvin, [place], PA-C  cetirizine (ZYRTEC ALLERGY) 10 MG tablet Take 1 tablet (10 mg total) by mouth daily. 04/06/20   Hall-Potvin, [place], PA-C  etonogestrel (NEXPLANON) 68 MG IMPL implant 1 each by Subdermal route once.    [provider]  triamcinolone cream (KENALOG) 0.1 % Apply 1 application topically 2 (two) times daily. 07/22/19   Cathie Hoops, Amy V, PA-C  omeprazole (PRILOSEC) 40 MG capsule Take 1 capsule (40 mg total) by mouth daily. Patient not taking: Reported on 10/29/2016 08/10/15 10/14/18  Collene Gobble, MD    Family History History reviewed. No pertinent family history.  Social History Social History   Tobacco Use  . Smoking status: Never Smoker  . Smokeless tobacco: Never Used   Substance Use Topics  . Alcohol use: No    Alcohol/week: 0.0 standard drinks  . Drug use: No     Allergies   Patient has no known allergies.   Review of Systems Review of Systems  Constitutional: Negative for fatigue and fever.  HENT: Positive for congestion and sore throat. Negative for dental problem, ear pain, facial swelling, hearing loss, sinus pain, trouble swallowing and voice change.   Eyes: Negative for photophobia, pain and visual disturbance.  Respiratory: Positive for cough. Negative for shortness of breath.   Cardiovascular: Negative for chest pain and palpitations.  Gastrointestinal: Negative for diarrhea and vomiting.  Musculoskeletal: Negative for arthralgias and myalgias.  Neurological: Negative for dizziness and headaches.     Physical Exam Triage Vital Signs ED Triage Vitals  Enc Vitals Group     BP 04/06/20 1300 (!) 145/88     Pulse Rate 04/06/20 1300 79     Resp 04/06/20 1300 16     Temp 04/06/20 1300 98 F (36.7 C)     Temp Source 04/06/20 1300 Oral     SpO2 04/06/20 1300 99 %     Weight --      Height --      Head Circumference --      Peak Flow --      Pain Score 04/06/20 1301 0     Pain Loc --      Pain Edu? --      Excl. in [place]? --  No data found.  Updated Vital Signs BP (!) 145/88 (BP Location: Left Arm)   Pulse 79   Temp 98 F (36.7 C) (Oral)   Resp 16   SpO2 99%   Visual Acuity Right Eye Distance:   Left Eye Distance:   Bilateral Distance:    Right Eye Near:   Left Eye Near:    Bilateral Near:     Physical Exam Constitutional:      General: She is not in acute distress.    Appearance: She is not ill-appearing or diaphoretic.  HENT:     Head: Normocephalic and atraumatic.     Right Ear: Tympanic membrane and ear canal normal.     Left Ear: Tympanic membrane and ear canal normal.     Mouth/Throat:     Mouth: Mucous membranes are moist.     Pharynx: Oropharynx is clear. No oropharyngeal exudate or posterior  oropharyngeal erythema.  Eyes:     General: No scleral icterus.    Conjunctiva/sclera: Conjunctivae normal.     Pupils: Pupils are equal, round, and reactive to light.  Neck:     Comments: Trachea midline, negative JVD Cardiovascular:     Rate and Rhythm: Normal rate and regular rhythm.     Heart sounds: No murmur heard. No gallop.   Pulmonary:     Effort: Pulmonary effort is normal. No respiratory distress.     Breath sounds: No wheezing, rhonchi or rales.  Musculoskeletal:     Cervical back: Neck supple. No tenderness.  Lymphadenopathy:     Cervical: No cervical adenopathy.  Skin:    Capillary Refill: Capillary refill takes less than 2 seconds.     Coloration: Skin is not jaundiced or pale.     Findings: No rash.  Neurological:     General: No focal deficit present.     Mental Status: She is alert and oriented to person, place, and time.      UC Treatments / Results  Labs (all labs ordered are listed, but only abnormal results are displayed) Labs Reviewed - No data to display  EKG   Radiology No results found.  Procedures Procedures (including critical care time)  Medications Ordered in UC Medications - No data to display  Initial Impression / Assessment and Plan / UC Course  I have reviewed the triage vital signs and the nursing notes.  Pertinent labs & imaging results that were available during my care of the patient were reviewed by me and considered in my medical decision making (see chart for details).     Appears well in office today.  We will treat supportively as below for suspected viral URI.  Return precautions discussed, pt verbalized understanding and is agreeable to plan. Final Clinical Impressions(s) / UC Diagnoses   Final diagnoses:  URI with cough and congestion     Discharge Instructions     Tessalon for cough. Start flonase, atrovent nasal spray for nasal congestion/drainage. You can use over the counter nasal saline rinse such as  neti pot for nasal congestion. Keep hydrated, your urine should be clear to pale yellow in color. Tylenol/motrin for fever and pain. Monitor for any worsening of symptoms, chest pain, shortness of breath, wheezing, swelling of the throat, go to the emergency department for further evaluation needed.     ED Prescriptions    Medication Sig Dispense Auth. Provider   cetirizine (ZYRTEC ALLERGY) 10 MG tablet Take 1 tablet (10 mg total) by mouth daily. 15 tablet Hall-Potvin, [place],  PA-C   fluticasone (FLONASE) 50 MCG/ACT nasal spray Place 1 spray into both nostrils daily. 16 g Hall-Potvin, [place], PA-C   benzonatate (TESSALON) 100 MG capsule Take 1 capsule (100 mg total) by mouth every 8 (eight) hours. 21 capsule Hall-Potvin, [place], PA-C   predniSONE (DELTASONE) 20 MG tablet Take 1 tablet (20 mg total) by mouth daily. 5 tablet Hall-Potvin, [place], PA-C     PDMP not reviewed this encounter.   Odette Fraction [place], [place] 04/06/20 1738

## 2020-04-07 NOTE — L&D Delivery Note (Signed)
Delivery Note   Patient Name: [name] DOB: 1996-03-11 MRN: [id number]  Date of admission: 12/22/2020 Delivering MD: Dale Stamford  Date of delivery: 12/22/20 Type of delivery: SVD  Newborn Data: Live born female  Birth Weight:   APGAR: 9, 9  Newborn Delivery   Birth date/time: 12/22/2020 08:35:00 Delivery type: Vaginal, Spontaneous     [name], 25 y.o., @ [redacted]w[redacted]d,  G2P1001, who was admitted for spontaneous labor, SROM prior to delivery. I was called to the room when she progressed 2+ station in the second stage of labor.  She pushed for 10/min.  She delivered a viable infant, cephalic and restituted to the LOA position over an intact perineum.  A nuchal cord   was not identified. The baby was placed on maternal abdomen while initial step of NRP were perfmored (Dry, Stimulated, and warmed). Hat placed on baby for thermoregulation. Delayed cord clamping was performed for 2 minutes.  Cord double clamped and cut.  Cord cut by father. Apgar scores were 9 and 9. Prophylactic Pitocin was started in the third stage of labor for active management. The placenta delivered spontaneously, shultz, with a 3 vessel cord and was sent to LD.  Inspection revealed 2nd degree. An examination of the vaginal vault and cervix was free from lacerations. The uterus was firm, bleeding stable.  The repair was done under epidural.   Placenta and umbilical artery blood gas were not sent.  There were no complications during the procedure.  Mom and baby skin to skin following delivery. Left in stable condition.  Maternal Info: Anesthesia: Epidural Episiotomy: no Lacerations:  2nd Suture Repair: 3.0 ct Monocryl Est. Blood Loss (mL):   Newborn Info:  Baby Sex: female APGAR (1 MIN): 9   APGAR (5 MINS): 9   APGAR (10 MINS):     Mom to postpartum.  Baby to Couplet care / Skin to Skin.   [place], [place], NP-C 12/22/20 9:03 AM

## 2020-04-11 ENCOUNTER — Other Ambulatory Visit: Payer: Self-pay

## 2020-04-11 ENCOUNTER — Ambulatory Visit
Admission: RE | Admit: 2020-04-11 | Discharge: 2020-04-11 | Disposition: A | Discharge status: Home / Self Care | Payer: BC Managed Care – PPO | Source: Ambulatory Visit | Attending: Internal Medicine | Admitting: Internal Medicine

## 2020-04-11 VITALS — BP 139/66 | HR 95 | Temp 98.6°F | Resp 18

## 2020-04-11 DIAGNOSIS — R058 Other specified cough: Secondary | ICD-10-CM

## 2020-04-11 DIAGNOSIS — K219 Gastro-esophageal reflux disease without esophagitis: Secondary | ICD-10-CM | POA: Diagnosis not present

## 2020-04-11 MED ORDER — PANTOPRAZOLE SODIUM 20 MG PO TBEC: 20.0000 mg | DELAYED_RELEASE_TABLET | Freq: Every day | ORAL | 0 refills | Status: DC

## 2020-04-11 MED ORDER — GUAIFENESIN ER 600 MG PO TB12: 600.0000 mg | ORAL_TABLET | Freq: Two times a day (BID) | ORAL | 0 refills | Status: AC

## 2020-04-11 MED ORDER — BENZONATATE 100 MG PO CAPS: 100.0000 mg | ORAL_CAPSULE | Freq: Three times a day (TID) | ORAL | 0 refills | Status: DC

## 2020-04-11 NOTE — Discharge Instructions (Signed)
Please take your medication as tolerated Increase oral fluid intake Return if symptoms worsen

## 2020-04-11 NOTE — ED Provider Notes (Signed)
[name]    CSN: [id number] Arrival date & time: 04/11/20  1801      History   Chief Complaint Chief Complaint  Patient presents with  . Appointment    1800  . Nasal Congestion    HPI Isabella Townsend is a 25 y.o. female comes to the urgent care with complaints of nasal congestion and feeling of mucus in her throat.  Patient says most of her symptoms occurs at night when she lays down to sleep.  She has a history of gastroesophageal reflux.  She does not take any medications for GERD at this time.  She has cough mainly at bedtime and during the daytime she has no symptoms.  No fever or chills.  No sputum production.  HPI  History reviewed. No pertinent past medical history.  There are no problems to display for this patient.   History reviewed. No pertinent surgical history.  OB History   No obstetric history on file.      Home Medications    Prior to Admission medications   Medication Sig Start Date End Date Taking? Authorizing Provider  benzonatate (TESSALON) 100 MG capsule Take 1 capsule (100 mg total) by mouth every 8 (eight) hours. 04/11/20  Yes Keeshia Sanderlin, Britta Mccreedy, MD  guaiFENesin (MUCINEX) 600 MG 12 hr tablet Take 1 tablet (600 mg total) by mouth 2 (two) times daily for 14 days. 04/11/20 04/25/20 Yes Tyris Eliot, Britta Mccreedy, MD  pantoprazole (PROTONIX) 20 MG tablet Take 1 tablet (20 mg total) by mouth daily. 04/11/20  Yes Nikol Lemar, Britta Mccreedy, MD  cetirizine (ZYRTEC ALLERGY) 10 MG tablet Take 1 tablet (10 mg total) by mouth daily. 04/06/20   Hall-Potvin, [place], PA-C  etonogestrel (NEXPLANON) 68 MG IMPL implant 1 each by Subdermal route once.    [provider]  fluticasone (FLONASE) 50 MCG/ACT nasal spray Place 1 spray into both nostrils daily. 04/06/20   Hall-Potvin, [place], PA-C  predniSONE (DELTASONE) 20 MG tablet Take 1 tablet (20 mg total) by mouth daily. 04/06/20   Hall-Potvin, [place], PA-C  triamcinolone cream (KENALOG) 0.1 % Apply 1 application  topically 2 (two) times daily. 07/22/19   Cathie Hoops, Amy V, PA-C  omeprazole (PRILOSEC) 40 MG capsule Take 1 capsule (40 mg total) by mouth daily. Patient not taking: Reported on 10/29/2016 08/10/15 10/14/18  Collene Gobble, MD    Family History History reviewed. No pertinent family history.  Social History Social History   Tobacco Use  . Smoking status: Never Smoker  . Smokeless tobacco: Never Used  Substance Use Topics  . Alcohol use: No    Alcohol/week: 0.0 standard drinks  . Drug use: No     Allergies   Patient has no known allergies.   Review of Systems Review of Systems  Respiratory: Positive for cough. Negative for shortness of breath and wheezing.   Gastrointestinal: Positive for nausea. Negative for abdominal pain, diarrhea and vomiting.  Neurological: Negative for headaches.     Physical Exam Triage Vital Signs ED Triage Vitals  Enc Vitals Group     BP 04/11/20 1836 139/66     Pulse Rate 04/11/20 1836 95     Resp 04/11/20 1836 18     Temp 04/11/20 1836 98.6 F (37 C)     Temp Source 04/11/20 1836 Oral     SpO2 04/11/20 1836 97 %     Weight --      Height --      Head Circumference --  Peak Flow --      Pain Score 04/11/20 1837 0     Pain Loc --      Pain Edu? --      Excl. in GC? --    No data found.  Updated Vital Signs BP 139/66 (BP Location: Left Arm)   Pulse 95   Temp 98.6 F (37 C) (Oral)   Resp 18   SpO2 97%   Visual Acuity Right Eye Distance:   Left Eye Distance:   Bilateral Distance:    Right Eye Near:   Left Eye Near:    Bilateral Near:     Physical Exam Vitals and nursing note reviewed.  Constitutional:      General: She is not in acute distress.    Appearance: She is not ill-appearing.  Cardiovascular:     Rate and Rhythm: Normal rate and regular rhythm.  Abdominal:     General: Abdomen is flat. Bowel sounds are normal. There is no distension.     Palpations: Abdomen is soft.     Tenderness: There is no abdominal  tenderness.     Hernia: No hernia is present.  Musculoskeletal:        General: Normal range of motion.  Skin:    General: Skin is warm and dry.  Neurological:     Mental Status: She is alert.      UC Treatments / Results  Labs (all labs ordered are listed, but only abnormal results are displayed) Labs Reviewed - No data to display  EKG   Radiology No results found.  Procedures Procedures (including critical care time)  Medications Ordered in UC Medications - No data to display  Initial Impression / Assessment and Plan / UC Course  I have reviewed the triage vital signs and the nursing notes.  Pertinent labs & imaging results that were available during my care of the patient were reviewed by me and considered in my medical decision making (see chart for details).     1.  Gastroesophageal reflux disease with nocturnal cough: Tessalon Perles as needed for cough Protonix 20 mg orally daily Mucinex as needed Patient is advised to use 2 pillows when she is sleeping to reduce the reflux symptoms. If patient's symptoms persist she is advised to return to the urgent care to be reevaluated. Final Clinical Impressions(s) / UC Diagnoses   Final diagnoses:  Gastroesophageal reflux disease without esophagitis  Nocturnal cough     Discharge Instructions     Please take your medication as tolerated Increase oral fluid intake Return if symptoms worsen   ED Prescriptions    Medication Sig Dispense Auth. Provider   pantoprazole (PROTONIX) 20 MG tablet Take 1 tablet (20 mg total) by mouth daily. 30 tablet Jaynia Fendley, Britta Mccreedy, MD   guaiFENesin (MUCINEX) 600 MG 12 hr tablet Take 1 tablet (600 mg total) by mouth 2 (two) times daily for 14 days. 28 tablet Kriston Pasquarello, Britta Mccreedy, MD   benzonatate (TESSALON) 100 MG capsule Take 1 capsule (100 mg total) by mouth every 8 (eight) hours. 21 capsule Greysen Devino, Britta Mccreedy, MD     PDMP not reviewed this encounter.   Merrilee Jansky,  MD 04/11/20 [id number]

## 2020-04-11 NOTE — ED Triage Notes (Signed)
Pt here for nasal congestion and mucous in her throat worse at night; seen for same on 12/31

## 2020-04-25 ENCOUNTER — Other Ambulatory Visit: Payer: Self-pay

## 2020-04-25 ENCOUNTER — Ambulatory Visit
Admission: RE | Admit: 2020-04-25 | Discharge: 2020-04-25 | Disposition: A | Discharge status: Home / Self Care | Payer: BC Managed Care – PPO | Source: Ambulatory Visit | Attending: Emergency Medicine | Admitting: Emergency Medicine

## 2020-04-25 VITALS — BP 142/88 | HR 92 | Temp 98.1°F | Resp 16

## 2020-04-25 DIAGNOSIS — R0982 Postnasal drip: Secondary | ICD-10-CM

## 2020-04-25 DIAGNOSIS — J029 Acute pharyngitis, unspecified: Secondary | ICD-10-CM

## 2020-04-25 DIAGNOSIS — Z3201 Encounter for pregnancy test, result positive: Secondary | ICD-10-CM

## 2020-04-25 LAB — POCT URINE PREGNANCY: Preg Test, Ur: POSITIVE — AB

## 2020-04-25 MED ORDER — PRENATAL COMPLETE 14-0.4 MG PO TABS: 1.0000 | ORAL_TABLET | Freq: Every day | ORAL | 0 refills | Status: DC

## 2020-04-25 MED ORDER — FLUTICASONE PROPIONATE 50 MCG/ACT NA SUSP: 1.0000 | Freq: Every day | NASAL | 0 refills | Status: DC

## 2020-04-25 MED ORDER — CETIRIZINE HCL 5 MG/5ML PO SOLN: 10.0000 mg | Freq: Every day | ORAL | 0 refills | Status: DC

## 2020-04-25 NOTE — Discharge Instructions (Addendum)
Pregnancy test positive Follow-up with OB/GYN Begin prenatal vitamin daily Begin daily cetirizine to help with postnasal drainage/throat irritation Flonase nasal spray to further help with congestion Tylenol for pain Continue to monitor, follow-up if not improving or worsening

## 2020-04-25 NOTE — ED Provider Notes (Signed)
[name]    CSN: [id number] Arrival date & time: 04/25/20  1628      History   Chief Complaint Chief Complaint  Patient presents with  . Cough  . Possible Pregnancy  . Appointment    1700    HPI Isabella Townsend is a 25 y.o. female presenting today for pregnancy test as well as sore throat/congestion.  Patient took an at-home pregnancy test which was positive, reports last menstrual cycle 03/16/2020.  Would like confirmation.  She denies any abdominal pain vaginal bleeding.  Has had prior pregnancy, denies any problems with prior pregnancy.  Denies nausea.  Also reports continued congestion and mucus in throat over the past 1 to 2 weeks.  Reports feeling mucus stuck in throat and needing to cough and clear this.  She has been using Delsym without relief.  Denies fevers chills or body aches.  HPI  History reviewed. No pertinent past medical history.  There are no problems to display for this patient.   History reviewed. No pertinent surgical history.  OB History    Gravida  1   Para      Term      Preterm      AB      Living        SAB      IAB      Ectopic      Multiple      Live Births               Home Medications    Prior to Admission medications   Medication Sig Start Date End Date Taking? Authorizing Provider  cetirizine HCl (ZYRTEC) 5 MG/5ML SOLN Take 10 mLs (10 mg total) by mouth daily. 04/25/20  Yes Ludy Messamore C, PA-C  fluticasone (FLONASE) 50 MCG/ACT nasal spray Place 1-2 sprays into both nostrils daily. 04/25/20  Yes Jermon Chalfant C, PA-C  Prenatal Vit-Fe Fumarate-FA (PRENATAL COMPLETE) 14-0.4 MG TABS Take 1 tablet by mouth daily. 04/25/20  Yes Sarabeth Benton C, PA-C  benzonatate (TESSALON) 100 MG capsule Take 1 capsule (100 mg total) by mouth every 8 (eight) hours. 04/11/20   Merrilee Jansky, MD  etonogestrel (NEXPLANON) 68 MG IMPL implant 1 each by Subdermal route once.    [provider]  pantoprazole  (PROTONIX) 20 MG tablet Take 1 tablet (20 mg total) by mouth daily. 04/11/20   Lamptey, Britta Mccreedy, MD  predniSONE (DELTASONE) 20 MG tablet Take 1 tablet (20 mg total) by mouth daily. 04/06/20   Hall-Potvin, [place], PA-C  triamcinolone cream (KENALOG) 0.1 % Apply 1 application topically 2 (two) times daily. 07/22/19   Cathie Hoops, Amy V, PA-C  omeprazole (PRILOSEC) 40 MG capsule Take 1 capsule (40 mg total) by mouth daily. Patient not taking: Reported on 10/29/2016 08/10/15 10/14/18  Collene Gobble, MD    Family History History reviewed. No pertinent family history.  Social History Social History   Tobacco Use  . Smoking status: Never Smoker  . Smokeless tobacco: Never Used  Substance Use Topics  . Alcohol use: No    Alcohol/week: 0.0 standard drinks  . Drug use: No     Allergies   Patient has no known allergies.   Review of Systems Review of Systems  Constitutional: Negative for activity change, appetite change, chills, fatigue and fever.  HENT: Positive for congestion, postnasal drip and sore throat. Negative for ear pain, rhinorrhea, sinus pressure and trouble swallowing.   Eyes: Negative for discharge and redness.  Respiratory: Positive for cough. Negative for chest tightness and shortness of breath.   Cardiovascular: Negative for chest pain.  Gastrointestinal: Negative for abdominal pain, diarrhea, nausea and vomiting.  Genitourinary: Positive for menstrual problem.  Musculoskeletal: Negative for myalgias.  Skin: Negative for rash.  Neurological: Negative for dizziness, light-headedness and headaches.     Physical Exam Triage Vital Signs ED Triage Vitals  Enc Vitals Group     BP 04/25/20 1714 (!) 142/88     Pulse Rate 04/25/20 1714 92     Resp 04/25/20 1714 16     Temp 04/25/20 1714 98.1 F (36.7 C)     Temp Source 04/25/20 1714 Oral     SpO2 04/25/20 1714 99 %     Weight --      Height --      Head Circumference --      Peak Flow --      Pain Score 04/25/20 1726 0      Pain Loc --      Pain Edu? --      Excl. in GC? --    No data found.  Updated Vital Signs BP (!) 142/88   Pulse 92   Temp 98.1 F (36.7 C) (Oral)   Resp 16   LMP 03/16/2020   SpO2 99%   Visual Acuity Right Eye Distance:   Left Eye Distance:   Bilateral Distance:    Right Eye Near:   Left Eye Near:    Bilateral Near:     Physical Exam Vitals and nursing note reviewed.  Constitutional:      Appearance: She is well-developed and well-nourished.     Comments: No acute distress  HENT:     Head: Normocephalic and atraumatic.     Ears:     Comments: Bilateral ears without tenderness to palpation of external auricle, tragus and mastoid, EAC's without erythema or swelling, TM's with good bony landmarks and cone of light. Non erythematous.     Nose: Nose normal.     Mouth/Throat:     Comments: Oral mucosa pink and moist, no tonsillar enlargement or exudate. Posterior pharynx patent and nonerythematous, no uvula deviation or swelling. Normal phonation. Eyes:     Conjunctiva/sclera: Conjunctivae normal.  Cardiovascular:     Rate and Rhythm: Normal rate.  Pulmonary:     Effort: Pulmonary effort is normal. No respiratory distress.     Comments: Breathing comfortably at rest, CTABL, no wheezing, rales or other adventitious sounds auscultated Abdominal:     General: There is no distension.  Musculoskeletal:        General: Normal range of motion.     Cervical back: Neck supple.  Skin:    General: Skin is warm and dry.  Neurological:     Mental Status: She is alert and oriented to person, place, and time.  Psychiatric:        Mood and Affect: Mood and affect normal.      UC Treatments / Results  Labs (all labs ordered are listed, but only abnormal results are displayed) Labs Reviewed  POCT URINE PREGNANCY - Abnormal; Notable for the following components:      Result Value   Preg Test, Ur Positive (*)    All other components within normal limits     EKG   Radiology No results found.  Procedures Procedures (including critical care time)  Medications Ordered in UC Medications - No data to display  Initial Impression / Assessment and Plan / UC  Course  I have reviewed the triage vital signs and the nursing notes.  Pertinent labs & imaging results that were available during my care of the patient were reviewed by me and considered in my medical decision making (see chart for details).     1. positive pregnancy test-follow-up with OB/GYN, begin prenatal vitamin  2.  Postnasal drainage recommending to initiate on daily antihistamine and Flonase consistently over the next 1 to 2 weeks.  Exam otherwise reassuring.  Vital signs stable.  Discussed strict return precautions. Patient verbalized understanding and is agreeable with plan.  Final Clinical Impressions(s) / UC Diagnoses   Final diagnoses:  Post-nasal drainage  Sore throat  Positive pregnancy test     Discharge Instructions     Pregnancy test positive Follow-up with OB/GYN Begin prenatal vitamin daily Begin daily cetirizine to help with postnasal drainage/throat irritation Flonase nasal spray to further help with congestion Tylenol for pain Continue to monitor, follow-up if not improving or worsening    ED Prescriptions    Medication Sig Dispense Auth. Provider   Prenatal Vit-Fe Fumarate-FA (PRENATAL COMPLETE) 14-0.4 MG TABS Take 1 tablet by mouth daily. 60 tablet Luis Sami C, PA-C   cetirizine HCl (ZYRTEC) 5 MG/5ML SOLN Take 10 mLs (10 mg total) by mouth daily. 300 mL Caryl Manas C, PA-C   fluticasone (FLONASE) 50 MCG/ACT nasal spray Place 1-2 sprays into both nostrils daily. 16 g Dawnette Mione, [place] C, PA-C     PDMP not reviewed this encounter.   Lew Dawes, PA-C 04/26/20 1038

## 2020-04-25 NOTE — ED Triage Notes (Signed)
Pt here for cough and congestion worse at night; pt seen for same on 1/5; pt also sts she did home pregnancy test that was positive and would like a test here; LMP 03/16/2020

## 2020-04-26 ENCOUNTER — Ambulatory Visit: Payer: Self-pay

## 2020-05-09 ENCOUNTER — Other Ambulatory Visit: Payer: Self-pay

## 2020-05-09 ENCOUNTER — Inpatient Hospital Stay (HOSPITAL_COMMUNITY)
Admission: AD | Admit: 2020-05-09 | Discharge: 2020-05-10 | Disposition: A | Discharge status: Home / Self Care | Payer: BC Managed Care – PPO | Attending: Obstetrics and Gynecology | Admitting: Obstetrics and Gynecology

## 2020-05-09 ENCOUNTER — Encounter (HOSPITAL_COMMUNITY): Payer: Self-pay | Admitting: Obstetrics & Gynecology

## 2020-05-09 DIAGNOSIS — G4700 Insomnia, unspecified: Secondary | ICD-10-CM | POA: Diagnosis not present

## 2020-05-09 DIAGNOSIS — Z79899 Other long term (current) drug therapy: Secondary | ICD-10-CM | POA: Diagnosis not present

## 2020-05-09 DIAGNOSIS — O219 Vomiting of pregnancy, unspecified: Secondary | ICD-10-CM | POA: Diagnosis not present

## 2020-05-09 DIAGNOSIS — O210 Mild hyperemesis gravidarum: Secondary | ICD-10-CM | POA: Diagnosis not present

## 2020-05-09 DIAGNOSIS — O26891 Other specified pregnancy related conditions, first trimester: Secondary | ICD-10-CM | POA: Insufficient documentation

## 2020-05-09 DIAGNOSIS — Z3A01 Less than 8 weeks gestation of pregnancy: Secondary | ICD-10-CM | POA: Diagnosis not present

## 2020-05-09 LAB — URINALYSIS, ROUTINE W REFLEX MICROSCOPIC
Bilirubin Urine: NEGATIVE
Glucose, UA: NEGATIVE mg/dL
Hgb urine dipstick: NEGATIVE
Ketones, ur: NEGATIVE mg/dL
Leukocytes,Ua: NEGATIVE
Nitrite: NEGATIVE
Protein, ur: NEGATIVE mg/dL
Specific Gravity, Urine: 1.009 (ref 1.005–1.030)
pH: 6 (ref 5.0–8.0)

## 2020-05-09 MED ORDER — PYRIDOXINE HCL 100 MG/ML IJ SOLN
100.0000 mg | Freq: Every day | INTRAMUSCULAR | Status: DC
  Administered 2020-05-09: 100 mg via INTRAVENOUS
  Filled 2020-05-09: qty 1

## 2020-05-09 MED ORDER — VITAMIN B-6 50 MG PO TABS: 50.0000 mg | ORAL_TABLET | Freq: Every day | ORAL | 0 refills | Status: DC

## 2020-05-09 MED ORDER — PROMETHAZINE HCL 25 MG PO TABS: 25.0000 mg | ORAL_TABLET | Freq: Four times a day (QID) | ORAL | 2 refills | Status: DC | PRN

## 2020-05-09 MED ORDER — LACTATED RINGERS IV BOLUS
1000.0000 mL | Freq: Once | INTRAVENOUS | Status: AC
  Administered 2020-05-09: 1000 mL via INTRAVENOUS

## 2020-05-09 MED ORDER — PROMETHAZINE HCL 25 MG/ML IJ SOLN
25.0000 mg | Freq: Once | INTRAMUSCULAR | Status: AC
  Administered 2020-05-09: 25 mg via INTRAVENOUS
  Filled 2020-05-09: qty 1

## 2020-05-09 NOTE — MAU Provider Note (Addendum)
Chief Complaint:  Emesis During Pregnancy   Event Date/Time   First Provider Initiated Contact with Patient 05/09/20 2036     HPI: Isabella Townsend is a 25 y.o. G2P1001 at [redacted]w[redacted]d who presents to maternity admissions reporting nausea and vomiting of pregnancy as well as insomnia, lack of energy, some constipation, and a nosebleed this morning. This is her first presentation for this problem. She did not have pregnancy sickness with her first baby. She is able to keep down water, but anytime she tries to eat food it comes back up. She has thrown up once today when she tried to eat lunch. She denies cramping, vaginal bleeding, fever, falls, or recent illness.  Pregnancy Course: Receives care at [facility]  History reviewed. No pertinent past medical history. OB History  Gravida Para Term Preterm AB Living  2 1 1     1   SAB IAB Ectopic Multiple Live Births          1    # Outcome Date GA Lbr Len/2nd Weight Sex Delivery Anes PTL Lv  2 Current           1 Term 02/03/14    M Vag-Spont   LIV   History reviewed. No pertinent surgical history. History reviewed. No pertinent family history. Social History   Tobacco Use  . Smoking status: Never Smoker  . Smokeless tobacco: Never Used  Substance Use Topics  . Alcohol use: No    Alcohol/week: 0.0 standard drinks  . Drug use: No   No Known Allergies Medications Prior to Admission  Medication Sig Dispense Refill Last Dose  . Prenatal Vit-Fe Fumarate-FA (PRENATAL COMPLETE) 14-0.4 MG TABS Take 1 tablet by mouth daily. 60 tablet 0 05/09/2020 at Unknown time  . benzonatate (TESSALON) 100 MG capsule Take 1 capsule (100 mg total) by mouth every 8 (eight) hours. 21 capsule 0   . cetirizine HCl (ZYRTEC) 5 MG/5ML SOLN Take 10 mLs (10 mg total) by mouth daily. 300 mL 0   . etonogestrel (NEXPLANON) 68 MG IMPL implant 1 each by Subdermal route once.     . fluticasone (FLONASE) 50 MCG/ACT nasal spray Place 1-2 sprays into both nostrils daily. 16 g 0    . pantoprazole (PROTONIX) 20 MG tablet Take 1 tablet (20 mg total) by mouth daily. 30 tablet 0   . predniSONE (DELTASONE) 20 MG tablet Take 1 tablet (20 mg total) by mouth daily. 5 tablet 0   . triamcinolone cream (KENALOG) 0.1 % Apply 1 application topically 2 (two) times daily. 30 g 0    I have reviewed patient's Past Medical Hx, Surgical Hx, Family Hx, Social Hx, medications and allergies.   ROS:  Review of Systems  Constitutional: Positive for fatigue. Negative for chills and fever.  HENT: Negative for congestion and sore throat.   Eyes: Negative for visual disturbance.  Respiratory: Negative for cough and shortness of breath.   Gastrointestinal: Positive for constipation, nausea and vomiting.  Genitourinary: Negative for pelvic pain, vaginal bleeding and vaginal discharge.  Neurological: Negative for dizziness, syncope, light-headedness and headaches.  Psychiatric/Behavioral: Positive for sleep disturbance.   Physical Exam   Patient Vitals for the past 24 hrs:  BP Temp Pulse Resp Height Weight  05/09/20 1957 134/86 -- (!) 108 -- -- --  05/09/20 1922 128/90 99.3 F (37.4 C) (!) 111 16 5\' 1"  (1.549 m) 161 lb (73 kg)   Constitutional: Well-developed, well-nourished female in no acute distress (sitting up in bed, playing on her  phone, very talkative).  Cardiovascular: normal rate & rhythm, no murmur Respiratory: normal effort, lung sounds clear throughout GI: Abd soft, non-tender, Pos BS x 4 MS: Extremities nontender, no edema, normal ROM Neurologic: Alert and oriented x 4.  GU: no CVA tenderness Pelvic exam deferred   Labs: Results for orders placed or performed during the hospital encounter of 05/09/20 (from the past 24 hour(s))  Urinalysis, Routine w reflex microscopic Urine, Clean Catch     Status: Abnormal   Collection Time: 05/09/20  7:43 PM  Result Value Ref Range   Color, Urine STRAW (A) YELLOW   APPearance CLEAR CLEAR   Specific Gravity, Urine 1.009 1.005 - 1.030    pH 6.0 5.0 - 8.0   Glucose, UA NEGATIVE NEGATIVE mg/dL   Hgb urine dipstick NEGATIVE NEGATIVE   Bilirubin Urine NEGATIVE NEGATIVE   Ketones, ur NEGATIVE NEGATIVE mg/dL   Protein, ur NEGATIVE NEGATIVE mg/dL   Nitrite NEGATIVE NEGATIVE   Leukocytes,Ua NEGATIVE NEGATIVE   Imaging:  No results found.  MAU Course: Orders Placed This Encounter  Procedures  . Urinalysis, Routine w reflex microscopic Urine, Clean Catch   Meds ordered this encounter  Medications  . pyridOXINE (B-6) injection 100 mg  . promethazine (PHENERGAN) injection 25 mg  . lactated ringers bolus 1,000 mL   MDM: LR bolus, B6 and phenergan ordered Care turned over to Wynelle Bourgeois, CNM at 2100  Edd Arbour, CNM, MSN, IBCLC  Tolerated crackers after infusion Discussed will Rx Phenergan and VItamin B6 Pt to followup with her provider  Assessment: Single IUP at [redacted]w[redacted]d Nausea and vomiting of pregnancy  Plan: Discharge home in stable condition.  Rx Phenergan for nausea Rx Vitamin B6  For nausea May need to restert Protonix Followup in office Encouraged to return if she develops worsening of symptoms, increase in pain, fever, or other concerning symptoms.   Aviva Signs, CNM

## 2020-05-09 NOTE — MAU Note (Signed)
N/V for a week. Unable to keep down anything for couple days. Not sleeping well. Some constipation. Denies VB or d/c. No pain

## 2020-05-09 NOTE — Discharge Instructions (Signed)
Morning Sickness  Morning sickness is when you feel like you may vomit (feel nauseous) during pregnancy. Sometimes, you may vomit. Morning sickness most often happens in the morning, but it can also happen at any time of the day. Some women may have morning sickness that makes them vomit all the time. This is a more serious problem that needs treatment. What are the causes? The cause of this condition is not known. What increases the risk?  You had vomiting or a feeling like you may vomit before your pregnancy.  You had morning sickness in another pregnancy.  You are pregnant with more than one baby, such as twins. What are the signs or symptoms?  Feeling like you may vomit.  Vomiting. How is this treated? Treatment is usually not needed for this condition. You may only need to change what you eat. In some cases, your doctor may give you some things to take for your condition. These include:  Vitamin B6 supplements.  Medicines to treat the feeling that you may vomit.  Ginger. Follow these instructions at home: Medicines  Take over-the-counter and prescription medicines only as told by your doctor. Do not take any medicines until you talk with your doctor about them first.  Take multivitamins before you get pregnant. These can stop or lessen the symptoms of morning sickness. Eating and drinking  Eat dry toast or crackers before getting out of bed.  Eat 5 or 6 small meals a day.  Eat dry and bland foods like rice and baked potatoes.  Do not eat greasy, fatty, or spicy foods.  Have someone cook for you if the smell of food causes you to vomit or to feel like you may vomit.  If you feel like you may vomit after taking prenatal vitamins, take them at night or with a snack.  Eat protein foods when you need a snack. Nuts, yogurt, and cheese are good choices.  Drink fluids throughout the day.  Try ginger ale made with real ginger, ginger tea made from fresh grated ginger, or  ginger candies. General instructions  Do not smoke or use any products that contain nicotine or tobacco. If you need help quitting, ask your doctor.  Use an air purifier to keep the air in your house free of smells.  Get lots of fresh air.  Try to avoid smells that make you feel sick.  Try wearing an acupressure wristband. This is a wristband that is used to treat seasickness.  Try a treatment called acupuncture. In this treatment, a doctor puts needles into certain areas of your body to make you feel better. Contact a doctor if:  You need medicine to feel better.  You feel dizzy or light-headed.  You are losing weight. Get help right away if:  The feeling that you may vomit will not go away, or you cannot stop vomiting.  You faint.  You have very bad pain in your belly. Summary  Morning sickness is when you feel like you may vomit (feel nauseous) during pregnancy.  You may feel sick in the morning, but you can feel this way at any time of the day.  Making some changes to what you eat may help your symptoms go away. This information is not intended to replace advice given to you by your health care provider. Make sure you discuss any questions you have with your health care provider. Document Revised: 11/07/2019 Document Reviewed: 10/17/2019 Elsevier Patient Education  2021 [employer]  

## 2020-05-22 LAB — OB RESULTS CONSOLE RUBELLA ANTIBODY, IGM: Rubella: IMMUNE

## 2020-05-22 LAB — OB RESULTS CONSOLE HIV ANTIBODY (ROUTINE TESTING): HIV: NONREACTIVE

## 2020-05-22 LAB — OB RESULTS CONSOLE HEPATITIS B SURFACE ANTIGEN: Hepatitis B Surface Ag: NEGATIVE

## 2020-05-22 LAB — OB RESULTS CONSOLE RPR: RPR: NONREACTIVE

## 2020-06-02 ENCOUNTER — Other Ambulatory Visit: Payer: Self-pay | Admitting: Advanced Practice Midwife

## 2020-06-05 ENCOUNTER — Inpatient Hospital Stay (HOSPITAL_COMMUNITY): Payer: BC Managed Care – PPO

## 2020-06-05 ENCOUNTER — Other Ambulatory Visit: Payer: Self-pay

## 2020-06-05 ENCOUNTER — Inpatient Hospital Stay (HOSPITAL_COMMUNITY)
Admission: AD | Admit: 2020-06-05 | Discharge: 2020-06-05 | Disposition: A | Discharge status: Home / Self Care | Payer: BC Managed Care – PPO | Attending: Obstetrics & Gynecology | Admitting: Obstetrics & Gynecology

## 2020-06-05 ENCOUNTER — Encounter (HOSPITAL_COMMUNITY): Payer: Self-pay | Admitting: Obstetrics & Gynecology

## 2020-06-05 DIAGNOSIS — Z6790 Unspecified blood type, Rh positive: Secondary | ICD-10-CM

## 2020-06-05 DIAGNOSIS — B3731 Acute candidiasis of vulva and vagina: Secondary | ICD-10-CM

## 2020-06-05 DIAGNOSIS — O208 Other hemorrhage in early pregnancy: Secondary | ICD-10-CM | POA: Diagnosis present

## 2020-06-05 DIAGNOSIS — B373 Candidiasis of vulva and vagina: Secondary | ICD-10-CM | POA: Diagnosis not present

## 2020-06-05 DIAGNOSIS — O98811 Other maternal infectious and parasitic diseases complicating pregnancy, first trimester: Secondary | ICD-10-CM

## 2020-06-05 DIAGNOSIS — O468X1 Other antepartum hemorrhage, first trimester: Secondary | ICD-10-CM | POA: Diagnosis not present

## 2020-06-05 DIAGNOSIS — O418X10 Other specified disorders of amniotic fluid and membranes, first trimester, not applicable or unspecified: Secondary | ICD-10-CM

## 2020-06-05 DIAGNOSIS — Z3A11 11 weeks gestation of pregnancy: Secondary | ICD-10-CM | POA: Diagnosis not present

## 2020-06-05 LAB — CBC
HCT: 38 % (ref 36.0–46.0)
Hemoglobin: 12.9 g/dL (ref 12.0–15.0)
MCH: 29.7 pg (ref 26.0–34.0)
MCHC: 33.9 g/dL (ref 30.0–36.0)
MCV: 87.4 fL (ref 80.0–100.0)
Platelets: 318 10*3/uL (ref 150–400)
RBC: 4.35 MIL/uL (ref 3.87–5.11)
RDW: 12.9 % (ref 11.5–15.5)
WBC: 11.7 10*3/uL — ABNORMAL HIGH (ref 4.0–10.5)
nRBC: 0 % (ref 0.0–0.2)

## 2020-06-05 LAB — GC/CHLAMYDIA PROBE AMP (~~LOC~~) NOT AT ARMC
Chlamydia: NEGATIVE
Comment: NEGATIVE
Comment: NORMAL
Neisseria Gonorrhea: NEGATIVE

## 2020-06-05 LAB — WET PREP, GENITAL
Clue Cells Wet Prep HPF POC: NONE SEEN
Sperm: NONE SEEN
Trich, Wet Prep: NONE SEEN

## 2020-06-05 LAB — URINALYSIS, ROUTINE W REFLEX MICROSCOPIC
Bilirubin Urine: NEGATIVE
Glucose, UA: NEGATIVE mg/dL
Ketones, ur: NEGATIVE mg/dL
Nitrite: NEGATIVE
Protein, ur: NEGATIVE mg/dL
Specific Gravity, Urine: 1.02 (ref 1.005–1.030)
pH: 5 (ref 5.0–8.0)

## 2020-06-05 LAB — ABO/RH: ABO/RH(D): A POS

## 2020-06-05 MED ORDER — TERCONAZOLE 0.4 % VA CREA: 1.0000 | TOPICAL_CREAM | Freq: Every day | VAGINAL | 0 refills | Status: DC

## 2020-06-05 NOTE — MAU Note (Signed)
.  Isabella Townsend is a 25 y.o. at [redacted]w[redacted]d here in MAU reporting: Vaginal bleeding that she first saw last night around 10pm and again this morning. She says it is only when she goes to the bathroom and has a small amount of blood on her pad. Reports right sided abdominal pain that only feels slightly uncomfortable.  Last intercourse: Last week Onset of complaint: yesterday Pain score: 2/10 FHT: 185 doppler Lab orders placed from triage: UA

## 2020-06-05 NOTE — Discharge Instructions (Signed)
Subchorionic Hematoma  A hematoma is a collection of blood outside of the blood vessels. A subchorionic hematoma is a collection of blood between the outer wall of the embryo (chorion) and the inner wall of the uterus. This condition can cause vaginal bleeding. Early small hematomas usually shrink on their own and do not affect your baby or pregnancy. When bleeding starts later in pregnancy, or if the hematoma is larger or occurs in older pregnant women, the condition may be more serious. Larger hematomas increase the chances of miscarriage. This condition also increases the risk of:  Premature separation of the placenta from the uterus.  Premature (preterm) labor.  Stillbirth. What are the causes? The exact cause of this condition is not known. It occurs when blood is trapped between the placenta and the uterine wall because the placenta has separated from the original site of implantation. What increases the risk? You are more likely to develop this condition if:  You were treated with fertility medicines.  You became pregnant through in vitro fertilization (IVF). What are the signs or symptoms? Symptoms of this condition include:  Vaginal spotting or bleeding.  Abdominal pain. This is rare. Sometimes you may have no symptoms and the bleeding may only be seen when ultrasound images are taken (transvaginal ultrasound). How is this diagnosed? This condition is diagnosed based on a physical exam. This includes a pelvic exam. You may also have other tests, including:  Blood tests.  Urine tests.  Ultrasound of the abdomen. How is this treated? Treatment for this condition can vary. Treatment may include:  Watchful waiting. You will be monitored closely for any changes in bleeding.  Medicines.  Activity restriction. This may be needed until the bleeding stops.  A medicine called Rh immunoglobulin. This is given if you have an Rh-negative blood type. It prevents Rh  sensitization. Follow these instructions at home:  Stay on bed rest if told to do so by your health care provider.  Do not lift anything that is heavier than 10 lb (4.5 kg), or the limit that you are told by your health care provider.  Track and write down the number of pads you use each day and how soaked (saturated) they are.  Do not use tampons.  Keep all follow-up visits. This is important. Your health care provider may ask you to have follow-up blood tests or ultrasound tests or both. Contact a health care provider if:  You have any vaginal bleeding.  You have a fever. Get help right away if:  You have severe cramps in your stomach, back, abdomen, or pelvis.  You pass large clots or tissue. Save any tissue for your health care provider to look at.  You faint.  You become light-headed or weak. Summary  A subchorionic hematoma is a collection of blood between the outer wall of the embryo (chorion) and the inner wall of the uterus.  This condition can cause vaginal bleeding.  Sometimes you may have no symptoms and the bleeding may only be seen when ultrasound images are taken.  Treatment may include watchful waiting, medicines, or activity restriction.  Keep all follow-up visits. Get help right away if you have severe cramps or heavy vaginal bleeding. This information is not intended to replace advice given to you by your health care provider. Make sure you discuss any questions you have with your health care provider. Document Revised: 12/19/2019 Document Reviewed: 12/19/2019 Elsevier Patient Education  2021 Elsevier Inc.   Vaginal Yeast Infection, Adult  Vaginal yeast infection is a condition that causes vaginal discharge as well as soreness, swelling, and redness (inflammation) of the vagina. This is a common condition. Some women get this infection frequently. What are the causes? This condition is caused by a change in the normal balance of the yeast (candida)  and bacteria that live in the vagina. This change causes an overgrowth of yeast, which causes the inflammation. What increases the risk? The condition is more likely to develop in women who:  Take antibiotic medicines.  Have diabetes.  Take birth control pills.  Are pregnant.  Douche often.  Have a weak body defense system (immune system).  Have been taking steroid medicines for a long time.  Frequently wear tight clothing. What are the signs or symptoms? Symptoms of this condition include:  White, thick, creamy vaginal discharge.  Swelling, itching, redness, and irritation of the vagina. The lips of the vagina (vulva) may be affected as well.  Pain or a burning feeling while urinating.  Pain during sex. How is this diagnosed? This condition is diagnosed based on:  Your medical history.  A physical exam.  A pelvic exam. Your health care provider will examine a sample of your vaginal discharge under a microscope. Your health care provider may send this sample for testing to confirm the diagnosis. How is this treated? This condition is treated with medicine. Medicines may be over-the-counter or prescription. You may be told to use one or more of the following:  Medicine that is taken by mouth (orally).  Medicine that is applied as a cream (topically).  Medicine that is inserted directly into the vagina (suppository). Follow these instructions at home: Lifestyle  Do not have sex until your health care provider approves. Tell your sex partner that you have a yeast infection. That person should go to his or her health care provider and ask if they should also be treated.  Do not wear tight clothes, such as pantyhose or tight pants.  Wear breathable cotton underwear. General instructions  Take or apply over-the-counter and prescription medicines only as told by your health care provider.  Eat more yogurt. This may help to keep your yeast infection from  returning.  Do not use tampons until your health care provider approves.  Try taking a sitz bath to help with discomfort. This is a warm water bath that is taken while you are sitting down. The water should only come up to your hips and should cover your buttocks. Do this 3-4 times per day or as told by your health care provider.  Do not douche.  If you have diabetes, keep your blood sugar levels under control.  Keep all follow-up visits as told by your health care provider. This is important.   Contact a health care provider if:  You have a fever.  Your symptoms go away and then return.  Your symptoms do not get better with treatment.  Your symptoms get worse.  You have new symptoms.  You develop blisters in or around your vagina.  You have blood coming from your vagina and it is not your menstrual period.  You develop pain in your abdomen. Summary  Vaginal yeast infection is a condition that causes discharge as well as soreness, swelling, and redness (inflammation) of the vagina.  This condition is treated with medicine. Medicines may be over-the-counter or prescription.  Take or apply over-the-counter and prescription medicines only as told by your health care provider.  Do not douche. Do not have  sex or use tampons until your health care provider approves.  Contact a health care provider if your symptoms do not get better with treatment or your symptoms go away and then return. This information is not intended to replace advice given to you by your health care provider. Make sure you discuss any questions you have with your health care provider. Document Revised: 10/22/2018 Document Reviewed: 08/10/2017 Elsevier Patient Education  2021 [employer].

## 2020-06-05 NOTE — MAU Provider Note (Signed)
History     CSN: [id number]  Arrival date and time: 06/05/20 0348   Event Date/Time   First Provider Initiated Contact with Patient 06/05/20 [phone]      Chief Complaint  Patient presents with  . Vaginal Bleeding   25 y.o. G2P1001 @11 .4 wks presenting with VB. Reports onset around 10pm last night. She first noticed pink/red when she wiped then she had bleeding again around 2am onto her pad. Endorses white vaginal discharge x2 days. No itching or malodor. Reports mild low abdominal pain. No recent sex. Has started care and states was normal.  OB History    Gravida  2   Para  1   Term  1   Preterm      AB      Living  1     SAB      IAB      Ectopic      Multiple      Live Births  1           History reviewed. No pertinent past medical history.  History reviewed. No pertinent surgical history.  No family history on file.  Social History   Tobacco Use  . Smoking status: Never Smoker  . Smokeless tobacco: Never Used  Substance Use Topics  . Alcohol use: No    Alcohol/week: 0.0 standard drinks  . Drug use: No    Allergies: No Known Allergies  Medications Prior to Admission  Medication Sig Dispense Refill Last Dose  . Prenatal Vit-Fe Fumarate-FA (PRENATAL COMPLETE) 14-0.4 MG TABS Take 1 tablet by mouth daily. 60 tablet 0 06/04/2020 at Unknown time  . promethazine (PHENERGAN) 25 MG tablet Take 1 tablet (25 mg total) by mouth every 6 (six) hours as needed for nausea or vomiting. 30 tablet 2 06/04/2020 at Unknown time  . pyridOXINE (VITAMIN B-6) 50 MG tablet Take 1 tablet (50 mg total) by mouth daily. 30 tablet 0 06/04/2020 at Unknown time  . benzonatate (TESSALON) 100 MG capsule Take 1 capsule (100 mg total) by mouth every 8 (eight) hours. 21 capsule 0   . cetirizine HCl (ZYRTEC) 5 MG/5ML SOLN Take 10 mLs (10 mg total) by mouth daily. 300 mL 0   . fluticasone (FLONASE) 50 MCG/ACT nasal spray Place 1-2 sprays into both nostrils daily. 16 g 0   .  pantoprazole (PROTONIX) 20 MG tablet Take 1 tablet (20 mg total) by mouth daily. 30 tablet 0   . predniSONE (DELTASONE) 20 MG tablet Take 1 tablet (20 mg total) by mouth daily. 5 tablet 0   . triamcinolone cream (KENALOG) 0.1 % Apply 1 application topically 2 (two) times daily. 30 g 0     Review of Systems  Gastrointestinal: Positive for abdominal pain.  Genitourinary: Positive for vaginal bleeding.   Physical Exam   Blood pressure 128/80, pulse 86, temperature 98.8 F (37.1 C), resp. rate 18, last menstrual period 03/16/2020, SpO2 100 %.  Physical Exam Vitals and nursing note reviewed. Exam conducted with a chaperone present.  Constitutional:      General: She is not in acute distress.    Appearance: Normal appearance.  HENT:     Head: Normocephalic and atraumatic.  Cardiovascular:     Rate and Rhythm: Normal rate.  Pulmonary:     Effort: Pulmonary effort is normal. No respiratory distress.  Abdominal:     General: There is no distension.     Palpations: Abdomen is soft. There is no mass.  Tenderness: There is no abdominal tenderness. There is no guarding or rebound.     Hernia: No hernia is present.  Genitourinary:    Comments: External: no lesions or erythema Vagina: rugated, pink, moist, small amt bleeding after cervical contact with speculum; thick yellow curdy discharge present Uterus: + enlarged, anteverted, non tender, no CMT Adnexae: no masses, no tenderness left, no tenderness right Cervix closed/long  Musculoskeletal:        General: Normal range of motion.     Cervical back: Normal range of motion.  Skin:    General: Skin is warm and dry.  Neurological:     General: No focal deficit present.     Mental Status: She is alert and oriented to person, place, and time.  Psychiatric:        Mood and Affect: Mood normal.        Behavior: Behavior normal.   FHT 186  Results for orders placed or performed during the hospital encounter of 06/05/20 (from the past  24 hour(s))  Urinalysis, Routine w reflex microscopic Urine, Clean Catch     Status: Abnormal   Collection Time: 06/05/20  4:07 AM  Result Value Ref Range   Color, Urine YELLOW YELLOW   APPearance HAZY (A) CLEAR   Specific Gravity, Urine 1.020 1.005 - 1.030   pH 5.0 5.0 - 8.0   Glucose, UA NEGATIVE NEGATIVE mg/dL   Hgb urine dipstick LARGE (A) NEGATIVE   Bilirubin Urine NEGATIVE NEGATIVE   Ketones, ur NEGATIVE NEGATIVE mg/dL   Protein, ur NEGATIVE NEGATIVE mg/dL   Nitrite NEGATIVE NEGATIVE   Leukocytes,Ua MODERATE (A) NEGATIVE   RBC / HPF 0-5 0 - 5 RBC/hpf   WBC, UA 11-20 0 - 5 WBC/hpf   Bacteria, UA FEW (A) NONE SEEN   Squamous Epithelial / LPF 11-20 0 - 5   Mucus PRESENT   Wet prep, genital     Status: Abnormal   Collection Time: 06/05/20  4:38 AM  Result Value Ref Range   Yeast Wet Prep HPF POC PRESENT (A) NONE SEEN   Trich, Wet Prep NONE SEEN NONE SEEN   Clue Cells Wet Prep HPF POC NONE SEEN NONE SEEN   WBC, Wet Prep HPF POC MANY (A) NONE SEEN   Sperm NONE SEEN   CBC     Status: Abnormal   Collection Time: 06/05/20  4:39 AM  Result Value Ref Range   WBC 11.7 (H) 4.0 - 10.5 K/uL   RBC 4.35 3.87 - 5.11 MIL/uL   Hemoglobin 12.9 12.0 - 15.0 g/dL   HCT 75.1 02.5 - 85.2 %   MCV 87.4 80.0 - 100.0 fL   MCH 29.7 26.0 - 34.0 pg   MCHC 33.9 30.0 - 36.0 g/dL   RDW 77.8 24.2 - 35.3 %   Platelets 318 150 - 400 K/uL   nRBC 0.0 0.0 - 0.2 %  ABO/Rh     Status: None   Collection Time: 06/05/20  4:39 AM  Result Value Ref Range   ABO/RH(D) A POS    No rh immune globuloin      NOT A RH IMMUNE GLOBULIN CANDIDATE, PT RH POSITIVE Performed at [facility], [address], [place], [place] [address]    [place] Comp Less 14 Wks  Result Date: 06/05/2020 CLINICAL DATA:  Vaginal bleeding.  LMP 03/16/2020 EXAM: OBSTETRIC <14 WK ULTRASOUND TECHNIQUE: Transabdominal ultrasound was performed for evaluation of the gestation as well as the maternal uterus and adnexal regions.  COMPARISON:   None. FINDINGS: Intrauterine gestational sac: Present, single Yolk sac:  Not Visualized. Embryo:  Present, single Cardiac Activity: Present, regular Heart Rate: 184 bpm MSD: Appropriate given fetal size CRL:   41 mm   10 w 6 d                  [place] EDC: 12/26/2020 Subchorionic hemorrhage: A moderate subchorionic hemorrhage is identified comprising 25-50% of the chorionic surface. Maternal uterus/adnexae: The maternal ovaries are unremarkable. There is no free fluid within the cul-de-sac. IMPRESSION: Single living intrauterine gestation with an estimated gestational age of [redacted] weeks, 6 days. Moderate subchorionic hemorrhage Electronically Signed   By: Helyn Numbers MD   On: 06/05/2020 05:23   MAU Course  Procedures  MDM Declined interpreter, speaks and understands English. Labs and [place] ordered and reviewed. Moderate sized SCH seen on [place]. Will treat yeast. GC pending. Discussed findings and plan with pt, verbalizes understanding. Stable for discharge home.  Assessment and Plan   1. [redacted] weeks gestation of pregnancy   2. Yeast vaginitis   3. Blood type, Rh positive   4. Subchorionic hematoma in first trimester, single or unspecified fetus    Discharge home [facility] as scheduled Pelvic rest SAB/bleeding precautions Rx Terazol  Allergies as of 06/05/2020   No Known Allergies     Medication List    STOP taking these medications   benzonatate 100 MG capsule Commonly known as: TESSALON   cetirizine HCl 5 MG/5ML Soln Commonly known as: Zyrtec   fluticasone 50 MCG/ACT nasal spray Commonly known as: FLONASE   pantoprazole 20 MG tablet Commonly known as: PROTONIX   predniSONE 20 MG tablet Commonly known as: DELTASONE   triamcinolone 0.1 % Commonly known as: KENALOG     TAKE these medications   Prenatal Complete 14-0.4 MG Tabs Take 1 tablet by mouth daily.   promethazine 25 MG tablet Commonly known as: PHENERGAN Take 1 tablet (25 mg total) by mouth every 6 (six) hours as  needed for nausea or vomiting.   pyridOXINE 50 MG tablet Commonly known as: VITAMIN B-6 Take 1 tablet (50 mg total) by mouth daily.   terconazole 0.4 % vaginal cream Commonly known as: TERAZOL 7 Place 1 applicator vaginally at bedtime.       Donette Larry, CNM 06/05/2020, 5:34 AM

## 2020-08-05 DIAGNOSIS — Z419 Encounter for procedure for purposes other than remedying health state, unspecified: Secondary | ICD-10-CM | POA: Diagnosis not present

## 2020-08-14 DIAGNOSIS — Z36 Encounter for antenatal screening for chromosomal anomalies: Secondary | ICD-10-CM | POA: Diagnosis not present

## 2020-08-20 ENCOUNTER — Other Ambulatory Visit: Payer: Self-pay

## 2020-08-20 ENCOUNTER — Encounter: Payer: Self-pay | Admitting: Emergency Medicine

## 2020-08-20 ENCOUNTER — Ambulatory Visit
Admission: EM | Admit: 2020-08-20 | Discharge: 2020-08-20 | Disposition: A | Discharge status: Home / Self Care | Payer: BC Managed Care – PPO | Attending: Internal Medicine | Admitting: Internal Medicine

## 2020-08-20 DIAGNOSIS — L282 Other prurigo: Secondary | ICD-10-CM

## 2020-08-20 MED ORDER — CALAMINE EX LOTN
1.0000 | TOPICAL_LOTION | CUTANEOUS | 0 refills | Status: DC | PRN
Start: 2020-08-20 — End: 2020-12-24

## 2020-08-20 MED ORDER — DIPHENHYDRAMINE-ZINC ACETATE 2-0.1 % EX CREA: 1.0000 "application " | TOPICAL_CREAM | Freq: Three times a day (TID) | CUTANEOUS | 0 refills | Status: DC | PRN

## 2020-08-20 NOTE — Discharge Instructions (Signed)
Apply the rash to the area affected Because you are pregnant, I would not give you oral steroids.  If symptoms worsen please return to urgent care to be reevaluated.

## 2020-08-20 NOTE — ED Triage Notes (Signed)
Pt is present today with a rash located on legs, stomach, back, arms, and chest. Pt states that she f/u with her OBGYN and was prescribed is not helping and pt is still uncomfortable

## 2020-08-20 NOTE — ED Provider Notes (Signed)
[name]    CSN: [id number] Arrival date & time: 08/20/20  1339      History   Chief Complaint Chief Complaint  Patient presents with  . Rash    HPI Isabella Townsend is a 25 y.o. female comes to the urgent care with 1 day history of pruritic rash over the lower extremities, lower abdomen and arms as well as chest.  Patient has been planting some flowers in her garden prior to onset of the symptoms.   Patient may have been exposed to poison ivy.  She has had poison ivy in the past.  She is currently 6 months pregnant.  No fever or chills.  No nausea or vomiting.  No facial rash.  HPI  History reviewed. No pertinent past medical history.  There are no problems to display for this patient.   History reviewed. No pertinent surgical history.  OB History    Gravida  2   Para  1   Term  1   Preterm      AB      Living  1     SAB      IAB      Ectopic      Multiple      Live Births  1            Home Medications    Prior to Admission medications   Medication Sig Start Date End Date Taking? Authorizing Provider  calamine lotion Apply 1 application topically as needed for itching. 08/20/20  Yes Merrel Crabbe, Britta Mccreedy, MD  diphenhydrAMINE-zinc acetate (BENADRYL EXTRA STRENGTH) cream Apply 1 application topically 3 (three) times daily as needed for itching. 08/20/20  Yes Novah Nessel, Britta Mccreedy, MD  Prenatal Vit-Fe Fumarate-FA (PRENATAL COMPLETE) 14-0.4 MG TABS Take 1 tablet by mouth daily. 04/25/20   Wieters, Hallie C, PA-C  promethazine (PHENERGAN) 25 MG tablet Take 1 tablet (25 mg total) by mouth every 6 (six) hours as needed for nausea or vomiting. 05/09/20   Aviva Signs, CNM  pyridOXINE (VITAMIN B-6) 50 MG tablet Take 1 tablet (50 mg total) by mouth daily. 05/09/20   Aviva Signs, CNM  terconazole (TERAZOL 7) 0.4 % vaginal cream Place 1 applicator vaginally at bedtime. 06/05/20   Donette Larry, CNM  triamcinolone ointment (KENALOG) 0.1 % triamcinolone  acetonide 0.1 % topical ointment  APPLY THIN COAT TO AFFECTED AREA TWICE A DAY    [provider]  omeprazole (PRILOSEC) 40 MG capsule Take 1 capsule (40 mg total) by mouth daily. Patient not taking: Reported on 10/29/2016 08/10/15 10/14/18  Collene Gobble, MD    Family History History reviewed. No pertinent family history.  Social History Social History   Tobacco Use  . Smoking status: Never Smoker  . Smokeless tobacco: Never Used  Substance Use Topics  . Alcohol use: No    Alcohol/week: 0.0 standard drinks  . Drug use: No     Allergies   Patient has no known allergies.   Review of Systems Review of Systems  Respiratory: Negative.   Gastrointestinal: Negative.   Skin: Positive for rash. Negative for color change and wound.  Neurological: Negative.      Physical Exam Triage Vital Signs ED Triage Vitals  Enc Vitals Group     BP 08/20/20 1545 120/85     Pulse Rate 08/20/20 1545 100     Resp 08/20/20 1545 18     Temp 08/20/20 1545 97.9 F (36.6 C)  Temp Source 08/20/20 1545 Oral     SpO2 08/20/20 1545 98 %     Weight --      Height --      Head Circumference --      Peak Flow --      Pain Score 08/20/20 1542 5     Pain Loc --      Pain Edu? --      Excl. in GC? --    No data found.  Updated Vital Signs BP 120/85   Pulse 100   Temp 97.9 F (36.6 C) (Oral)   Resp 18   LMP 03/16/2020   SpO2 98%   Visual Acuity Right Eye Distance:   Left Eye Distance:   Bilateral Distance:    Right Eye Near:   Left Eye Near:    Bilateral Near:     Physical Exam Vitals and nursing note reviewed.  Constitutional:      General: She is not in acute distress.    Appearance: She is not ill-appearing.  Cardiovascular:     Rate and Rhythm: Normal rate and regular rhythm.  Musculoskeletal:        General: Normal range of motion.  Skin:    Findings: Rash present.     Comments: Papular rash with minimal erythematous base over the lower extremities, lower  abdomen and chest area.  Few excoriations on the thighs bilaterally  Neurological:     Mental Status: She is alert.      UC Treatments / Results  Labs (all labs ordered are listed, but only abnormal results are displayed) Labs Reviewed - No data to display  EKG   Radiology No results found.  Procedures Procedures (including critical care time)  Medications Ordered in UC Medications - No data to display  Initial Impression / Assessment and Plan / UC Course  I have reviewed the triage vital signs and the nursing notes.  Pertinent labs & imaging results that were available during my care of the patient were reviewed by me and considered in my medical decision making (see chart for details).     1.  Pruritic rash and suspected poison ivy dermatitis: Calamine lotion Benadryl ointment Return to urgent care if symptoms worsen. Final Clinical Impressions(s) / UC Diagnoses   Final diagnoses:  Pruritic rash     Discharge Instructions     Apply the rash to the area affected Because you are pregnant, I would not give you oral steroids.  If symptoms worsen please return to urgent care to be reevaluated.   ED Prescriptions    Medication Sig Dispense Auth. Provider   calamine lotion Apply 1 application topically as needed for itching. 120 mL Tahje Borawski, Britta Mccreedy, MD   diphenhydrAMINE-zinc acetate (BENADRYL EXTRA STRENGTH) cream Apply 1 application topically 3 (three) times daily as needed for itching. 28.4 g Kaytlen Lightsey, Britta Mccreedy, MD     PDMP not reviewed this encounter.   Merrilee Jansky, MD 08/20/20 [phone]

## 2020-09-05 DIAGNOSIS — Z419 Encounter for procedure for purposes other than remedying health state, unspecified: Secondary | ICD-10-CM | POA: Diagnosis not present

## 2020-09-13 DIAGNOSIS — Z369 Encounter for antenatal screening, unspecified: Secondary | ICD-10-CM | POA: Diagnosis not present

## 2020-10-05 DIAGNOSIS — Z419 Encounter for procedure for purposes other than remedying health state, unspecified: Secondary | ICD-10-CM | POA: Diagnosis not present

## 2020-11-05 DIAGNOSIS — Z419 Encounter for procedure for purposes other than remedying health state, unspecified: Secondary | ICD-10-CM | POA: Diagnosis not present

## 2020-11-28 LAB — OB RESULTS CONSOLE GBS: GBS: NEGATIVE

## 2020-12-06 DIAGNOSIS — Z419 Encounter for procedure for purposes other than remedying health state, unspecified: Secondary | ICD-10-CM | POA: Diagnosis not present

## 2020-12-21 ENCOUNTER — Encounter (HOSPITAL_COMMUNITY): Payer: Self-pay | Admitting: Obstetrics and Gynecology

## 2020-12-21 ENCOUNTER — Other Ambulatory Visit: Payer: Self-pay

## 2020-12-21 ENCOUNTER — Other Ambulatory Visit: Payer: Self-pay | Admitting: Obstetrics & Gynecology

## 2020-12-21 ENCOUNTER — Inpatient Hospital Stay (HOSPITAL_COMMUNITY)
Admission: AD | Admit: 2020-12-21 | Discharge: 2020-12-22 | Disposition: A | Discharge status: Home / Self Care | Payer: BC Managed Care – PPO | Source: Home / Self Care | Attending: Obstetrics and Gynecology | Admitting: Obstetrics and Gynecology

## 2020-12-21 DIAGNOSIS — Z3A40 40 weeks gestation of pregnancy: Secondary | ICD-10-CM | POA: Insufficient documentation

## 2020-12-21 DIAGNOSIS — O471 False labor at or after 37 completed weeks of gestation: Secondary | ICD-10-CM | POA: Insufficient documentation

## 2020-12-21 DIAGNOSIS — O479 False labor, unspecified: Secondary | ICD-10-CM

## 2020-12-21 NOTE — MAU Note (Signed)
.  Isabella Townsend is a 25 y.o. at [redacted]w[redacted]d here in MAU reporting: contractions started about 1800 today and are 3 mins apart. With a pain of 4/10. No bleeding or abnormal fluid. No LOF. +FM  Onset of complaint: 12/21/2020 Pain score: 4/10 Vitals:   12/21/20 2218  BP: 126/88  Pulse: (!) 105  Resp: 18  Temp: 98.5 F (36.9 C)  SpO2: 100%     FHT:133 Lab orders placed from triage:  UA

## 2020-12-22 ENCOUNTER — Inpatient Hospital Stay (HOSPITAL_COMMUNITY)
Admission: AD | Admit: 2020-12-22 | Discharge: 2020-12-24 | DRG: 807 | Disposition: A | Discharge status: Home / Self Care | Payer: BC Managed Care – PPO | Attending: Obstetrics & Gynecology | Admitting: Obstetrics & Gynecology

## 2020-12-22 ENCOUNTER — Inpatient Hospital Stay (HOSPITAL_COMMUNITY): Payer: BC Managed Care – PPO | Admitting: Anesthesiology

## 2020-12-22 ENCOUNTER — Encounter (HOSPITAL_COMMUNITY): Payer: Self-pay | Admitting: Obstetrics and Gynecology

## 2020-12-22 DIAGNOSIS — O26893 Other specified pregnancy related conditions, third trimester: Secondary | ICD-10-CM | POA: Diagnosis present

## 2020-12-22 DIAGNOSIS — Z20822 Contact with and (suspected) exposure to covid-19: Secondary | ICD-10-CM | POA: Diagnosis present

## 2020-12-22 DIAGNOSIS — Z3A40 40 weeks gestation of pregnancy: Secondary | ICD-10-CM | POA: Diagnosis not present

## 2020-12-22 LAB — RESP PANEL BY RT-PCR (FLU A&B, COVID) ARPGX2
Influenza A by PCR: NEGATIVE
Influenza B by PCR: NEGATIVE
SARS Coronavirus 2 by RT PCR: NEGATIVE

## 2020-12-22 LAB — TYPE AND SCREEN
ABO/RH(D): A POS
Antibody Screen: NEGATIVE

## 2020-12-22 LAB — CBC
HCT: 37.7 % (ref 36.0–46.0)
Hemoglobin: 12.7 g/dL (ref 12.0–15.0)
MCH: 28.9 pg (ref 26.0–34.0)
MCHC: 33.7 g/dL (ref 30.0–36.0)
MCV: 85.7 fL (ref 80.0–100.0)
Platelets: 322 10*3/uL (ref 150–400)
RBC: 4.4 MIL/uL (ref 3.87–5.11)
RDW: 13.6 % (ref 11.5–15.5)
WBC: 21.9 10*3/uL — ABNORMAL HIGH (ref 4.0–10.5)
nRBC: 0 % (ref 0.0–0.2)

## 2020-12-22 LAB — RPR: RPR Ser Ql: NONREACTIVE

## 2020-12-22 MED ORDER — ZOLPIDEM TARTRATE 5 MG PO TABS: 5.0000 mg | ORAL_TABLET | Freq: Every evening | ORAL | Status: DC | PRN

## 2020-12-22 MED ORDER — PHENYLEPHRINE 40 MCG/ML (10ML) SYRINGE FOR IV PUSH (FOR BLOOD PRESSURE SUPPORT): 80.0000 ug | PREFILLED_SYRINGE | INTRAVENOUS | Status: DC | PRN

## 2020-12-22 MED ORDER — OXYCODONE-ACETAMINOPHEN 5-325 MG PO TABS: 2.0000 | ORAL_TABLET | ORAL | Status: DC | PRN

## 2020-12-22 MED ORDER — LACTATED RINGERS IV SOLN: INTRAVENOUS | Status: DC

## 2020-12-22 MED ORDER — FENTANYL-BUPIVACAINE-NACL 0.5-0.125-0.9 MG/250ML-% EP SOLN
12.0000 mL/h | EPIDURAL | Status: DC | PRN
  Administered 2020-12-22: 12 mL/h via EPIDURAL

## 2020-12-22 MED ORDER — LACTATED RINGERS IV SOLN: 500.0000 mL | INTRAVENOUS | Status: DC | PRN

## 2020-12-22 MED ORDER — ONDANSETRON HCL 4 MG PO TABS: 4.0000 mg | ORAL_TABLET | ORAL | Status: DC | PRN

## 2020-12-22 MED ORDER — PRENATAL MULTIVITAMIN CH
1.0000 | ORAL_TABLET | Freq: Every day | ORAL | Status: DC
  Administered 2020-12-22 – 2020-12-24 (×3): 1 via ORAL
  Filled 2020-12-22 (×3): qty 1

## 2020-12-22 MED ORDER — ACETAMINOPHEN 325 MG PO TABS: 650.0000 mg | ORAL_TABLET | ORAL | Status: DC | PRN

## 2020-12-22 MED ORDER — FENTANYL-BUPIVACAINE-NACL 0.5-0.125-0.9 MG/250ML-% EP SOLN
EPIDURAL | Status: AC
  Filled 2020-12-22: qty 250

## 2020-12-22 MED ORDER — EPHEDRINE 5 MG/ML INJ: 10.0000 mg | INTRAVENOUS | Status: DC | PRN

## 2020-12-22 MED ORDER — OXYTOCIN BOLUS FROM INFUSION
333.0000 mL | Freq: Once | INTRAVENOUS | Status: AC
  Administered 2020-12-22: 333 mL via INTRAVENOUS

## 2020-12-22 MED ORDER — OXYCODONE-ACETAMINOPHEN 5-325 MG PO TABS
1.0000 | ORAL_TABLET | ORAL | Status: DC | PRN
Start: 2020-12-22 — End: 2020-12-22

## 2020-12-22 MED ORDER — COCONUT OIL OIL: 1.0000 "application " | TOPICAL_OIL | Status: DC | PRN

## 2020-12-22 MED ORDER — DIBUCAINE (PERIANAL) 1 % EX OINT: 1.0000 "application " | TOPICAL_OINTMENT | CUTANEOUS | Status: DC | PRN

## 2020-12-22 MED ORDER — DIPHENHYDRAMINE HCL 25 MG PO CAPS: 25.0000 mg | ORAL_CAPSULE | Freq: Four times a day (QID) | ORAL | Status: DC | PRN

## 2020-12-22 MED ORDER — BENZOCAINE-MENTHOL 20-0.5 % EX AERO
1.0000 "application " | INHALATION_SPRAY | CUTANEOUS | Status: DC | PRN
  Administered 2020-12-22: 1 via TOPICAL
  Filled 2020-12-22: qty 56

## 2020-12-22 MED ORDER — LIDOCAINE HCL (PF) 1 % IJ SOLN: 30.0000 mL | INTRAMUSCULAR | Status: DC | PRN

## 2020-12-22 MED ORDER — ONDANSETRON HCL 4 MG/2ML IJ SOLN: 4.0000 mg | Freq: Four times a day (QID) | INTRAMUSCULAR | Status: DC | PRN

## 2020-12-22 MED ORDER — SIMETHICONE 80 MG PO CHEW: 80.0000 mg | CHEWABLE_TABLET | ORAL | Status: DC | PRN

## 2020-12-22 MED ORDER — MORPHINE SULFATE (PF) 4 MG/ML IV SOLN
4.0000 mg | Freq: Once | INTRAVENOUS | Status: AC
  Administered 2020-12-22: 4 mg via INTRAMUSCULAR
  Filled 2020-12-22: qty 1

## 2020-12-22 MED ORDER — LIDOCAINE-EPINEPHRINE (PF) 2 %-1:200000 IJ SOLN
INTRAMUSCULAR | Status: DC | PRN
  Administered 2020-12-22: 5 mL via EPIDURAL

## 2020-12-22 MED ORDER — LACTATED RINGERS IV SOLN
500.0000 mL | Freq: Once | INTRAVENOUS | Status: AC
  Administered 2020-12-22: 500 mL via INTRAVENOUS

## 2020-12-22 MED ORDER — DIPHENHYDRAMINE HCL 50 MG/ML IJ SOLN: 12.5000 mg | INTRAMUSCULAR | Status: DC | PRN

## 2020-12-22 MED ORDER — OXYTOCIN-SODIUM CHLORIDE 30-0.9 UT/500ML-% IV SOLN
2.5000 [IU]/h | INTRAVENOUS | Status: DC
  Administered 2020-12-22: 2.5 [IU]/h via INTRAVENOUS
  Filled 2020-12-22: qty 500

## 2020-12-22 MED ORDER — IBUPROFEN 600 MG PO TABS
600.0000 mg | ORAL_TABLET | Freq: Four times a day (QID) | ORAL | Status: DC
  Administered 2020-12-22 – 2020-12-24 (×9): 600 mg via ORAL
  Filled 2020-12-22 (×9): qty 1

## 2020-12-22 MED ORDER — FENTANYL CITRATE (PF) 100 MCG/2ML IJ SOLN: 50.0000 ug | INTRAMUSCULAR | Status: DC | PRN

## 2020-12-22 MED ORDER — TETANUS-DIPHTH-ACELL PERTUSSIS 5-2.5-18.5 LF-MCG/0.5 IM SUSY: 0.5000 mL | PREFILLED_SYRINGE | Freq: Once | INTRAMUSCULAR | Status: DC

## 2020-12-22 MED ORDER — ONDANSETRON HCL 4 MG/2ML IJ SOLN: 4.0000 mg | INTRAMUSCULAR | Status: DC | PRN

## 2020-12-22 MED ORDER — SENNOSIDES-DOCUSATE SODIUM 8.6-50 MG PO TABS
2.0000 | ORAL_TABLET | Freq: Every day | ORAL | Status: DC
  Administered 2020-12-23 – 2020-12-24 (×2): 2 via ORAL
  Filled 2020-12-22 (×2): qty 2

## 2020-12-22 MED ORDER — SOD CITRATE-CITRIC ACID 500-334 MG/5ML PO SOLN: 30.0000 mL | ORAL | Status: DC | PRN

## 2020-12-22 MED ORDER — WITCH HAZEL-GLYCERIN EX PADS: 1.0000 "application " | MEDICATED_PAD | CUTANEOUS | Status: DC | PRN

## 2020-12-22 NOTE — Anesthesia Postprocedure Evaluation (Signed)
Anesthesia Post Note  Patient: Isabella Townsend  Procedure(s) Performed: AN AD HOC LABOR EPIDURAL     Patient location during evaluation: Mother Baby Anesthesia Type: Epidural Level of consciousness: awake and alert and oriented Pain management: satisfactory to patient Vital Signs Assessment: post-procedure vital signs reviewed and stable Respiratory status: respiratory function stable Cardiovascular status: stable Postop Assessment: no headache, no backache, epidural receding, patient able to bend at knees, no signs of nausea or vomiting and adequate PO intake Anesthetic complications: no   No notable events documented.  Last Vitals:  Vitals:   12/22/20 1125 12/22/20 1540  BP: 100/61 111/67  Pulse: 97 94  Resp: 18 17  Temp: 37.1 C 37.1 C  SpO2: 98% 97%    Last Pain:  Vitals:   12/22/20 1540  TempSrc: Oral  PainSc: 0-No pain   Pain Goal:                   Dash Cardarelli

## 2020-12-22 NOTE — Anesthesia Preprocedure Evaluation (Signed)
Anesthesia Evaluation  Patient identified by MRN, date of birth, ID band Patient awake    Reviewed: Allergy & Precautions, NPO status , Patient's Chart, lab work & pertinent test results  Airway Mallampati: II  TM Distance: >3 FB Neck ROM: Full    Dental no notable dental hx.    Pulmonary neg pulmonary ROS,    Pulmonary exam normal breath sounds clear to auscultation       Cardiovascular negative cardio ROS Normal cardiovascular exam Rhythm:Regular Rate:Normal     Neuro/Psych negative neurological ROS  negative psych ROS   GI/Hepatic negative GI ROS, Neg liver ROS,   Endo/Other  negative endocrine ROS  Renal/GU negative Renal ROS  negative genitourinary   Musculoskeletal negative musculoskeletal ROS (+)   Abdominal   Peds  Hematology negative hematology ROS (+)   Anesthesia Other Findings Presented in spontaneous labor  Reproductive/Obstetrics                             Anesthesia Physical Anesthesia Plan  ASA: 2  Anesthesia Plan: Epidural   Post-op Pain Management:    Induction:   PONV Risk Score and Plan: Treatment may vary due to age or medical condition  Airway Management Planned: Natural Airway  Additional Equipment:   Intra-op Plan:   Post-operative Plan:   Informed Consent: I have reviewed the patients History and Physical, chart, labs and discussed the procedure including the risks, benefits and alternatives for the proposed anesthesia with the patient or authorized representative who has indicated his/her understanding and acceptance.       Plan Discussed with: Anesthesiologist  Anesthesia Plan Comments: (Patient identified. Risks, benefits, options discussed with patient including but not limited to bleeding, infection, nerve damage, paralysis, failed block, incomplete pain control, headache, blood pressure changes, nausea, vomiting, reactions to medication,  itching, and post partum back pain. Confirmed with bedside nurse the patient's most recent platelet count. Confirmed with the patient that they are not taking any anticoagulation, have any bleeding history or any family history of bleeding disorders. Patient expressed understanding and wishes to proceed. All questions were answered. )        Anesthesia Quick Evaluation

## 2020-12-22 NOTE — MAU Note (Signed)
Isabella Townsend is a 25 y.o. at [redacted]w[redacted]d here in MAU reporting: contractions every 2 mins, pain 10/10  Onset of complaint: 12/21/2020 Pain score: 10/10 Vitals:   12/22/20 0451 12/22/20 0458  BP:  130/84  Pulse: (!) 103   Resp: 18   Temp: 98.6 F (37 C)      FHT:148 Lab orders placed from triage:

## 2020-12-22 NOTE — Anesthesia Procedure Notes (Signed)
Epidural Patient location during procedure: OB Start time: 12/22/2020 5:50 AM End time: 12/22/2020 6:00 AM  Staffing Anesthesiologist: Elmer Picker, MD Performed: anesthesiologist   Preanesthetic Checklist Completed: patient identified, IV checked, risks and benefits discussed, monitors and equipment checked, pre-op evaluation and timeout performed  Epidural Patient position: sitting Prep: DuraPrep and site prepped and draped Patient monitoring: continuous pulse ox, blood pressure, heart rate and cardiac monitor Approach: midline Location: L3-L4 Injection technique: LOR air  Needle:  Needle type: Tuohy  Needle gauge: 17 G Needle length: 9 cm Needle insertion depth: 6 cm Catheter type: closed end flexible Catheter size: 19 Gauge Catheter at skin depth: 12 cm Test dose: negative  Assessment Sensory level: T8 Events: blood not aspirated, injection not painful, no injection resistance, no paresthesia and negative IV test  Additional Notes Patient identified. Risks/Benefits/Options discussed with patient including but not limited to bleeding, infection, nerve damage, paralysis, failed block, incomplete pain control, headache, blood pressure changes, nausea, vomiting, reactions to medication both or allergic, itching and postpartum back pain. Confirmed with bedside nurse the patient's most recent platelet count. Confirmed with patient that they are not currently taking any anticoagulation, have any bleeding history or any family history of bleeding disorders. Patient expressed understanding and wished to proceed. All questions were answered. Sterile technique was used throughout the entire procedure. Please see nursing notes for vital signs. Test dose was given through epidural catheter and negative prior to continuing to dose epidural or start infusion. Warning signs of high block given to the patient including shortness of breath, tingling/numbness in hands, complete motor block,  or any concerning symptoms with instructions to call for help. Patient was given instructions on fall risk and not to get out of bed. All questions and concerns addressed with instructions to call with any issues or inadequate analgesia.  Reason for block:procedure for pain

## 2020-12-22 NOTE — H&P (Signed)
Isabella Townsend is a 25 y.o. female, G2P1001, IUP at 40.1 weeks, presenting for spontaneous labor 4cm dilated. GBS-. LR female. Pt endorse + Fm. Denies vaginal leakage. +Bloody show     There are no problems to display for this patient.    Active Ambulatory Problems    Diagnosis Date Noted   No Active Ambulatory Problems   Resolved Ambulatory Problems    Diagnosis Date Noted   No Resolved Ambulatory Problems   No Additional Past Medical History      Medications Prior to Admission  Medication Sig Dispense Refill Last Dose   calamine lotion Apply 1 application topically as needed for itching. 120 mL 0    diphenhydrAMINE-zinc acetate (BENADRYL EXTRA STRENGTH) cream Apply 1 application topically 3 (three) times daily as needed for itching. 28.4 g 0    Prenatal Vit-Fe Fumarate-FA (PRENATAL COMPLETE) 14-0.4 MG TABS Take 1 tablet by mouth daily. 60 tablet 0    promethazine (PHENERGAN) 25 MG tablet Take 1 tablet (25 mg total) by mouth every 6 (six) hours as needed for nausea or vomiting. 30 tablet 2    pyridOXINE (VITAMIN B-6) 50 MG tablet Take 1 tablet (50 mg total) by mouth daily. 30 tablet 0    terconazole (TERAZOL 7) 0.4 % vaginal cream Place 1 applicator vaginally at bedtime. 45 g 0    triamcinolone ointment (KENALOG) 0.1 % triamcinolone acetonide 0.1 % topical ointment  APPLY THIN COAT TO AFFECTED AREA TWICE A DAY       No past medical history on file.   No current facility-administered medications on file prior to encounter.   Current Outpatient Medications on File Prior to Encounter  Medication Sig Dispense Refill   calamine lotion Apply 1 application topically as needed for itching. 120 mL 0   diphenhydrAMINE-zinc acetate (BENADRYL EXTRA STRENGTH) cream Apply 1 application topically 3 (three) times daily as needed for itching. 28.4 g 0   Prenatal Vit-Fe Fumarate-FA (PRENATAL COMPLETE) 14-0.4 MG TABS Take 1 tablet by mouth daily. 60 tablet 0   promethazine (PHENERGAN) 25 MG tablet  Take 1 tablet (25 mg total) by mouth every 6 (six) hours as needed for nausea or vomiting. 30 tablet 2   pyridOXINE (VITAMIN B-6) 50 MG tablet Take 1 tablet (50 mg total) by mouth daily. 30 tablet 0   terconazole (TERAZOL 7) 0.4 % vaginal cream Place 1 applicator vaginally at bedtime. 45 g 0   triamcinolone ointment (KENALOG) 0.1 % triamcinolone acetonide 0.1 % topical ointment  APPLY THIN COAT TO AFFECTED AREA TWICE A DAY     [DISCONTINUED] omeprazole (PRILOSEC) 40 MG capsule Take 1 capsule (40 mg total) by mouth daily. (Patient not taking: Reported on 10/29/2016) 30 capsule 1     No Known Allergies  History of present pregnancy: Pt Info/Preference:  Screening/Consents:  Labs:   EDD: Estimated Date of Delivery: 12/21/20  Establised: Patient's last menstrual period was 03/16/2020.  Anatomy Scan: Date: 08/14/2020 Placenta Location: fundal Genetic Screen: [place]:LR female AFP:  First Tri: Quad:  Office: [facility]            First PNV: 10.4 weeks Blood Type --/--/A POS (03/01 0439)  Language: Marliss Czar a language that is a Sales executive Last PNV: 40 weeks Rhogam  N/a  Flu Vaccine:  declined   Antibody  neg  TDaP vaccine declined   GTT: Early: 4.9 Third Trimester: 108  Feeding Plan: breast BTL: no Rubella:  immune  Contraception: ??? VBAC: no RPR:   ne  Circumcision: N/A   HBsAg:  neg  Pediatrician:  ???   HIV:   neg  Prenatal Classes: no Additional [place]: 8/17 efw 6.2lbs 52%, fundal placenta, afi 19.9, vertex GBS:  Negative(For PCN allergy, check sensitivities)       Chlamydia: neg    MFM Referral/Consult:  GC: neg  Support Person: husband   PAP: ???  Pain Management: epidural Neonatologist Referral:  Hgb Electrophoresis:  AA  Birth Plan: DCC   Hgb NOB: 13.3    28W: 12.8   OB History     Gravida  2   Para  1   Term  1   Preterm      AB      Living  1      SAB      IAB      Ectopic      Multiple      Live Births  1          No past medical history on  file. No past surgical history on file. Family History: family history is not on file. Social History:  reports that she has never smoked. She has never used smokeless tobacco. She reports that she does not drink alcohol and does not use drugs.   Prenatal Transfer Tool  Maternal Diabetes: No Genetic Screening: Normal Maternal Ultrasounds/Referrals: Normal Fetal Ultrasounds or other Referrals:  None Maternal Substance Abuse:  No Significant Maternal Medications:  None Significant Maternal Lab Results: Group B Strep negative  ROS:  Review of Systems  Constitutional: Negative.   HENT: Negative.    Eyes: Negative.   Respiratory: Negative.    Cardiovascular: Negative.   Gastrointestinal:  Positive for abdominal pain.  Genitourinary: Negative.   Musculoskeletal: Negative.   Skin: Negative.   Neurological: Negative.   Endo/Heme/Allergies: Negative.   Psychiatric/Behavioral: Negative.      Physical Exam: BP 130/84   Pulse (!) 103   Temp 98.6 F (37 C) (Oral)   Resp 18   LMP 03/16/2020   Physical Exam Vitals and nursing note reviewed.  Constitutional:      Appearance: Normal appearance.  HENT:     Head: Normocephalic and atraumatic.     Nose: Nose normal.     Mouth/Throat:     Mouth: Mucous membranes are moist.  Eyes:     Pupils: Pupils are equal, round, and reactive to light.  Cardiovascular:     Rate and Rhythm: Normal rate and regular rhythm.     Pulses: Normal pulses.     Heart sounds: Normal heart sounds.  Pulmonary:     Effort: Pulmonary effort is normal.     Breath sounds: Normal breath sounds.  Abdominal:     General: Bowel sounds are normal.  Genitourinary:    Comments: Uterus gravida equal to dates, pelvis adequately.  Musculoskeletal:        General: Normal range of motion.     Cervical back: Normal range of motion and neck supple.  Skin:    General: Skin is warm.     Capillary Refill: Capillary refill takes less than 2 seconds.  Neurological:      General: No focal deficit present.     Mental Status: She is alert.  Psychiatric:        Mood and Affect: Mood normal.     NST: FHR baseline 140 bpm, Variability: moderate, Accelerations:present, Decelerations:  Absent= Cat 1/Reactive UC:   irregular, every 2-5 minutes SVE:   Dilation: 4.5 Effacement (%): 90  Station: -1 Exam by:: San Jetty, RN, vertex verified by fetal sutures.  Leopold's: Position vertex, EFW 7lbs via leopold's.  Pekvis proven 7.5lbs  Labs: No results found for this or any previous visit (from the past 24 hour(s)).  Imaging:  No results found.  MAU Course: Orders Placed This Encounter  Procedures   Resp Panel by RT-PCR (Flu A&B, Covid) Nasopharyngeal Swab   CBC   RPR   Order Rapid HIV per protocol if no results on chart   Airborne and Contact precautions   Type and screen [facility]   Insert and maintain IV Line   Meds ordered this encounter  Medications   lactated ringers infusion    Assessment/Plan: Isabella Townsend is a 25 y.o. female, G2P1001, IUP at 40.1 weeks, presenting for spontaneous labor 4cm dilated. GBS-. LR female. Pt endorse + Fm. Denies vaginal leakage. +Bloody show    FWB: Cat 1 Fetal Tracing.   Plan: Admit to Birthing Suite Routine [facility] orders Pain med/epidural prn Expectant management Anticipate labor progression   [facility] NP-C, CNM, MSN 12/22/2020, 5:20 AM

## 2020-12-22 NOTE — Plan of Care (Signed)
  Problem: Education: Goal: Knowledge of Childbirth will improve Outcome: Progressing Goal: Ability to make informed decisions regarding treatment and plan of care will improve Outcome: Progressing Goal: Ability to state and carry out methods to decrease the pain will improve Outcome: Progressing   

## 2020-12-23 LAB — CBC
HCT: 34.2 % — ABNORMAL LOW (ref 36.0–46.0)
Hemoglobin: 11.5 g/dL — ABNORMAL LOW (ref 12.0–15.0)
MCH: 28.9 pg (ref 26.0–34.0)
MCHC: 33.6 g/dL (ref 30.0–36.0)
MCV: 85.9 fL (ref 80.0–100.0)
Platelets: 287 10*3/uL (ref 150–400)
RBC: 3.98 MIL/uL (ref 3.87–5.11)
RDW: 13.7 % (ref 11.5–15.5)
WBC: 15.8 10*3/uL — ABNORMAL HIGH (ref 4.0–10.5)
nRBC: 0 % (ref 0.0–0.2)

## 2020-12-23 NOTE — Lactation Note (Signed)
This note was copied from a baby's chart. Lactation Consultation Note  Patient Name: Isabella Townsend Today's Date: 12/23/2020 Reason for consult: Initial assessment;Term Age:26 hours  Parents declined interpreter.   Initial visit to 65 hours old infant of a P2 mother. Mother requests assistance with latch. LC demonstrated alignment, support pillows, and hand expression. Infant is clusterfeeding, breastfeeds well for ~67minutes and falls sleep. LC demonstrated hand expression, tops baby up with 12mL of colostrum. Swaddled baby and placed in basinet. Demonstrated hand pump use, mother collects additional ~4mL.  Baby starts crying until she has an emesis. Encouraged skin to skin. Discussed normal behavior and patterns after 24h, signs of good milk transfer, hunger cues, tummy size and benefits of skin to skin.   Plan: 1-Deep, comfortable latch and breastfeeding on demand or 8-12 times in 24h period. 2-Encouraged maternal rest, hydration and food intake.  3-Contact LC as needed for feeds/support/concerns/questions   All questions answered at this time. Provided Lactation services brochure and promoted [employer] booklet information.     Maternal Data Has patient been taught Hand Expression?: Yes Does the patient have breastfeeding experience prior to this delivery?: Yes How long did the patient breastfeed?: >1 year  Feeding Mother's Current Feeding Choice: Breast Milk and Formula  LATCH Score Latch: Grasps breast easily, tongue down, lips flanged, rhythmical sucking.  Audible Swallowing: Spontaneous and intermittent  Type of Nipple: Everted at rest and after stimulation  Comfort (Breast/Nipple): Filling, red/small blisters or bruises, mild/mod discomfort  Hold (Positioning): Assistance needed to correctly position infant at breast and maintain latch.  LATCH Score: 8   Lactation Tools Discussed/Used Tools: Pump;Flanges Flange Size: 24;27 Breast pump type: Manual Pump Education: Milk  Storage;Setup, frequency, and cleaning Reason for Pumping: supplementation Pumping frequency: as needed Pumped volume: 5 mL  Interventions Interventions: Breast feeding basics reviewed;Assisted with latch;Skin to skin;Breast massage;Hand express;Adjust position;Support pillows;Education;Hand pump;Coconut oil;Expressed milk;Position options  Discharge Discharge Education: Engorgement and breast care Pump: Manual WIC Program: No  Consult Status Consult Status: Follow-up Date: 12/24/20 Follow-up type: In-patient    Jabar Krysiak A Higuera Ancidey 12/23/2020, 11:58 PM

## 2020-12-23 NOTE — Progress Notes (Signed)
PPD# 1 SVD w/ 2nd degree laceration Information for the patient's newborn:  Arntz, [name] [[id number]]  female   Baby Girl   S:   Reports feeling "ok" having occasional cramping. Tolerating PO fluid and solids No nausea or vomiting Bleeding is light Pain controlled with acetaminophen and ibuprofen (OTC) Up ad lib / ambulatory / voiding w/o difficulty Feeding: Breast    O:   VS: BP 111/70 (BP Location: Left Arm)   Pulse 85   Temp 98.1 F (36.7 C) (Oral)   Resp 18   Ht 5\' 1"  (1.549 m)   Wt 80.7 kg   LMP 03/16/2020   SpO2 99%   BMI 33.63 kg/m   LABS:  Recent Labs    12/22/20 0507 12/23/20 0433  WBC 21.9* 15.8*  HGB 12.7 11.5*  PLT 322 287   Blood type: --/--/A POS (09/17 0507) Rubella: Immune (02/15 0000)                      I&O: Intake/Output      09/17 0701 09/18 0700 09/18 0701 09/19 0700   I.V. (mL/kg)     Total Intake(mL/kg)     Urine (mL/kg/hr) 0 (0)    Blood 115    Total Output 115    Net -115         Urine Occurrence 2 x      Physical Exam: Alert and oriented X3 Lungs: Clear and unlabored Heart: regular rate and rhythm / no mumurs Abdomen: soft, non-tender, non-distended  Fundus: firm, non-tender, U-1 Perineum: 2nd degree, well approximated Lochia: light Extremities: negative edema, no calf pain, tenderness, or cords    A:  PPD # 1  Normal exam  P:  Routine post partum orders Anticipate D/C on 12/24/20   Plan reviewed w/ Dr. 12/26/20, MSN, CNM 12/23/2020, 10:33 AM

## 2020-12-24 MED ORDER — SENNOSIDES-DOCUSATE SODIUM 8.6-50 MG PO TABS: 2.0000 | ORAL_TABLET | Freq: Two times a day (BID) | ORAL | 3 refills | Status: AC | PRN

## 2020-12-24 MED ORDER — IBUPROFEN 600 MG PO TABS: 600.0000 mg | ORAL_TABLET | Freq: Four times a day (QID) | ORAL | 3 refills | Status: DC | PRN

## 2020-12-24 NOTE — Social Work (Signed)
MOB presented questions about Medicaid insurance for the infant. CSW notified the financial counselor.She will reach out to the [facility] before she leaves today via interpreter.    Vivi Barrack, MSW, LCSW [facility]  Clinical Social Worker  [phone] 12/24/2020  9:53 AM

## 2020-12-24 NOTE — Discharge Summary (Signed)
Postpartum Discharge Summary  Date of Service updated 12/24/20     Patient Name: Isabella Townsend DOB: 05/07/1995 MRN: [id number]  Date of admission: 12/22/2020 Delivery date:12/22/2020  Delivering provider: Noralyn Pick  Date of discharge: 12/24/2020  Admitting diagnosis: Normal labor and delivery [O80] Intrauterine pregnancy: [redacted]w[redacted]d    Secondary diagnosis:  Active Problems:   Normal labor and delivery  Additional problems: none    Discharge diagnosis: Term Pregnancy Delivered                                              Post partum procedures: none Augmentation: AROM and Pitocin Complications: None  Hospital course: Onset of Labor With Vaginal Delivery      25y.o. yo G2P1001 at 453w3das admitted in Active Labor on 12/22/2020. Patient had an uncomplicated labor course as follows:  Membrane Rupture Time/Date: 8:32 AM ,12/22/2020   Delivery Method:Vaginal, Spontaneous  Episiotomy: None  Lacerations:  2nd degree;Perineal  Patient had an uncomplicated postpartum course.  She is ambulating, tolerating a regular diet, passing flatus, and urinating well. Patient is discharged home in stable condition on 12/24/20.  Newborn Data: Birth date:12/22/2020  Birth time:8:35 AM  Gender:Female  Living status:Living  Apgars:9 ,9  Weight:3226 g   Magnesium Sulfate received: No BMZ received: No Rhophylac:No MMR:No T-DaP:Given prenatally Flu: No Transfusion:No  Physical exam  Vitals:   12/23/20 0453 12/23/20 1526 12/23/20 2030 12/24/20 0326  BP: 111/70 121/68 118/87 112/81  Pulse: 85 79 78 80  Resp: 18 16 18 19   Temp: 98.1 F (36.7 C) 98.4 F (36.9 C) 98.3 F (36.8 C) 98.2 F (36.8 C)  TempSrc: Oral Oral Oral Oral  SpO2: 99% 100% 100% 100%  Weight:      Height:       General: alert, cooperative, and no distress Lochia: appropriate Uterine Fundus: firm DVT Evaluation: No evidence of DVT seen on physical exam. Negative Homan's sign. No cords or calf tenderness. Labs: Lab  Results  Component Value Date   WBC 15.8 (H) 12/23/2020   HGB 11.5 (L) 12/23/2020   HCT 34.2 (L) 12/23/2020   MCV 85.9 12/23/2020   PLT 287 12/23/2020   CMP Latest Ref Rng & Units 08/10/2015  Glucose 65 - 99 mg/dL 69  BUN 7 - 25 mg/dL 9  Creatinine 0.50 - 1.10 mg/dL 0.52  Sodium 135 - 146 mmol/L 138  Potassium 3.5 - 5.3 mmol/L 4.8  Chloride 98 - 110 mmol/L 103  CO2 20 - 31 mmol/L 24  Calcium 8.6 - 10.2 mg/dL 9.6  Total Protein 6.1 - 8.1 g/dL 7.4  Total Bilirubin 0.2 - 1.2 mg/dL 0.6  Alkaline Phos 33 - 115 U/L 102  AST 10 - 30 U/L 20  ALT 6 - 29 U/L 17   Edinburgh Score: Edinburgh Postnatal Depression Scale Screening Tool 12/23/2020  I have been able to laugh and see the funny side of things. 0  I have looked forward with enjoyment to things. 0  I have blamed myself unnecessarily when things went wrong. 0  I have been anxious or worried for no good reason. 0  I have felt scared or panicky for no good reason. 0  Things have been getting on top of me. 0  I have been so unhappy that I have had difficulty sleeping. 0  I have felt sad or miserable.  0  I have been so unhappy that I have been crying. 0  The thought of harming myself has occurred to me. 0  Edinburgh Postnatal Depression Scale Total 0      After visit meds:  Allergies as of 12/24/2020   No Known Allergies      Medication List     STOP taking these medications    calamine lotion   diphenhydrAMINE-zinc acetate cream Commonly known as: Benadryl Extra Strength   promethazine 25 MG tablet Commonly known as: PHENERGAN   pyridOXINE 50 MG tablet Commonly known as: VITAMIN B-6   terconazole 0.4 % vaginal cream Commonly known as: TERAZOL 7   triamcinolone ointment 0.1 % Commonly known as: KENALOG       TAKE these medications    ibuprofen 600 MG tablet Commonly known as: ADVIL Take 1 tablet (600 mg total) by mouth every 6 (six) hours as needed for cramping or moderate pain.   Prenatal Complete  14-0.4 MG Tabs Take 1 tablet by mouth daily.   senna-docusate 8.6-50 MG tablet Commonly known as: Senokot-S Take 2 tablets by mouth 2 (two) times daily as needed for mild constipation.         Discharge home in stable condition Infant Feeding: Bottle and Breast Infant Disposition:home with mother Discharge instruction: per After Visit Summary and Postpartum booklet. Activity: Advance as tolerated. Pelvic rest for 6 weeks.  Diet: routine diet Anticipated Birth Control: POPs Postpartum Appointment:6 weeks  Future Appointments:Patient will call Follow up Visit:      12/24/2020 Sanjuana Kava, MD

## 2020-12-29 ENCOUNTER — Inpatient Hospital Stay (HOSPITAL_COMMUNITY): Payer: BC Managed Care – PPO

## 2020-12-29 ENCOUNTER — Inpatient Hospital Stay (HOSPITAL_COMMUNITY)
Admission: AD | Admit: 2020-12-29 | Payer: BC Managed Care – PPO | Source: Home / Self Care | Admitting: Obstetrics & Gynecology

## 2021-01-04 ENCOUNTER — Telehealth (HOSPITAL_COMMUNITY): Payer: Self-pay | Admitting: *Deleted

## 2021-01-04 NOTE — Telephone Encounter (Signed)
Interpreter left no message.  Duffy Rhody, RN 01-04-2021 at 1:50pm

## 2021-01-05 DIAGNOSIS — Z419 Encounter for procedure for purposes other than remedying health state, unspecified: Secondary | ICD-10-CM | POA: Diagnosis not present

## 2021-02-05 DIAGNOSIS — Z419 Encounter for procedure for purposes other than remedying health state, unspecified: Secondary | ICD-10-CM | POA: Diagnosis not present

## 2021-02-11 DIAGNOSIS — Z7689 Persons encountering health services in other specified circumstances: Secondary | ICD-10-CM | POA: Diagnosis not present

## 2021-02-11 DIAGNOSIS — Z719 Counseling, unspecified: Secondary | ICD-10-CM | POA: Diagnosis not present

## 2021-02-11 DIAGNOSIS — Z713 Dietary counseling and surveillance: Secondary | ICD-10-CM | POA: Diagnosis not present

## 2021-03-07 DIAGNOSIS — Z419 Encounter for procedure for purposes other than remedying health state, unspecified: Secondary | ICD-10-CM | POA: Diagnosis not present

## 2021-04-07 DIAGNOSIS — Z419 Encounter for procedure for purposes other than remedying health state, unspecified: Secondary | ICD-10-CM | POA: Diagnosis not present

## 2021-04-17 DIAGNOSIS — J029 Acute pharyngitis, unspecified: Secondary | ICD-10-CM | POA: Diagnosis not present

## 2021-04-17 DIAGNOSIS — R059 Cough, unspecified: Secondary | ICD-10-CM | POA: Diagnosis not present

## 2021-04-17 DIAGNOSIS — K5900 Constipation, unspecified: Secondary | ICD-10-CM | POA: Diagnosis not present

## 2021-04-17 DIAGNOSIS — J039 Acute tonsillitis, unspecified: Secondary | ICD-10-CM | POA: Diagnosis not present

## 2021-05-08 DIAGNOSIS — Z419 Encounter for procedure for purposes other than remedying health state, unspecified: Secondary | ICD-10-CM | POA: Diagnosis not present

## 2021-06-05 DIAGNOSIS — Z419 Encounter for procedure for purposes other than remedying health state, unspecified: Secondary | ICD-10-CM | POA: Diagnosis not present

## 2021-07-06 DIAGNOSIS — Z419 Encounter for procedure for purposes other than remedying health state, unspecified: Secondary | ICD-10-CM | POA: Diagnosis not present

## 2021-08-05 DIAGNOSIS — Z419 Encounter for procedure for purposes other than remedying health state, unspecified: Secondary | ICD-10-CM | POA: Diagnosis not present

## 2021-09-05 DIAGNOSIS — Z419 Encounter for procedure for purposes other than remedying health state, unspecified: Secondary | ICD-10-CM | POA: Diagnosis not present

## 2021-10-05 DIAGNOSIS — Z419 Encounter for procedure for purposes other than remedying health state, unspecified: Secondary | ICD-10-CM | POA: Diagnosis not present

## 2021-11-05 DIAGNOSIS — Z419 Encounter for procedure for purposes other than remedying health state, unspecified: Secondary | ICD-10-CM | POA: Diagnosis not present

## 2021-12-03 DIAGNOSIS — Z3041 Encounter for surveillance of contraceptive pills: Secondary | ICD-10-CM | POA: Diagnosis not present

## 2021-12-06 DIAGNOSIS — Z419 Encounter for procedure for purposes other than remedying health state, unspecified: Secondary | ICD-10-CM | POA: Diagnosis not present

## 2022-04-29 IMAGING — US US OB COMP LESS 14 WK
1 series · 15 of 28 positions shown · non-contrast
Comparison: None.

CLINICAL DATA: Vaginal bleeding.  LMP 03/16/2020

EXAM:
OBSTETRIC <14 WK ULTRASOUND
TECHNIQUE: Transabdominal ultrasound was performed for evaluation of the
gestation as well as the maternal uterus and adnexal regions.

[Series 1: us ob comp less 14 wk · 15 of 40 slices shown]
[im 1/40]
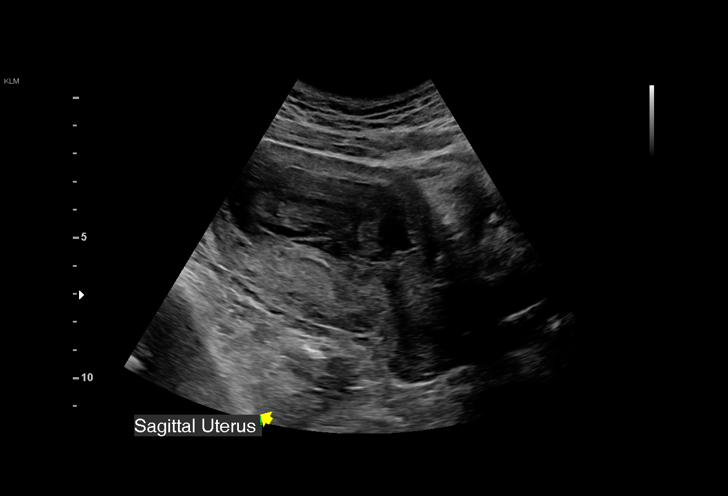
[im 3/40]
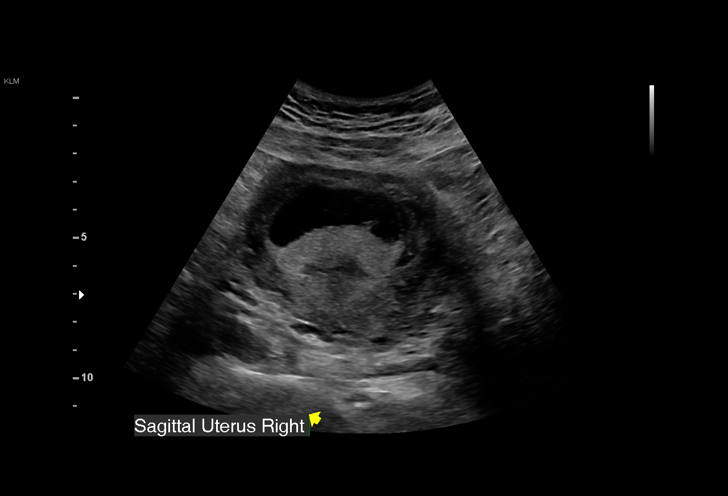
[im 6/40]
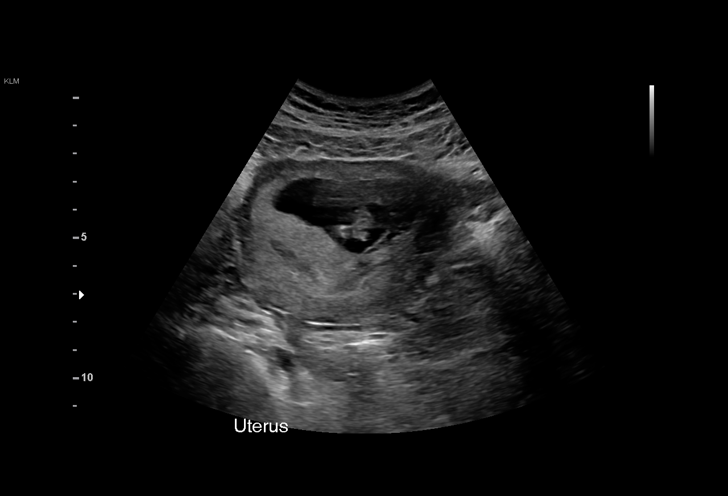
[im 9/40]
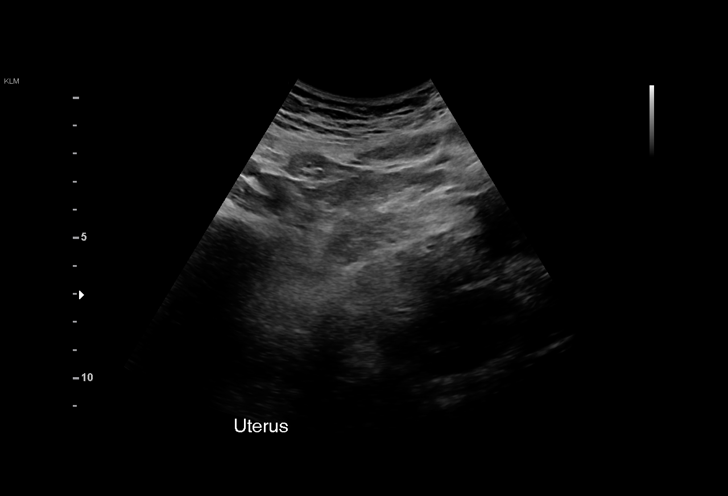
[im 12/40]
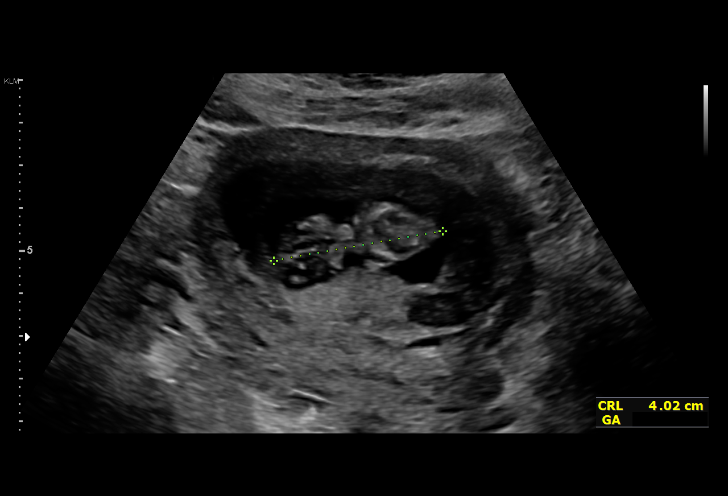
[im 15/40]
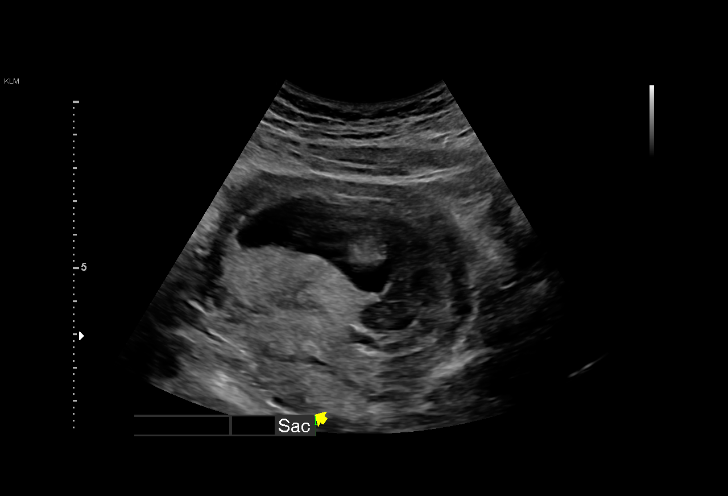
[im 18/40]
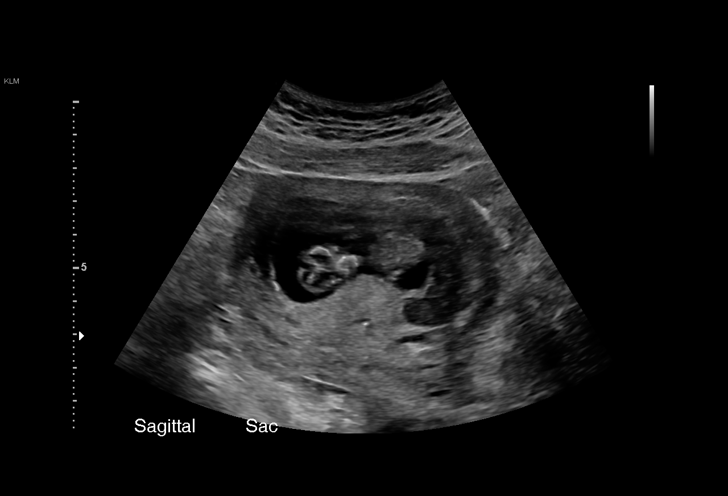
[im 21/40]
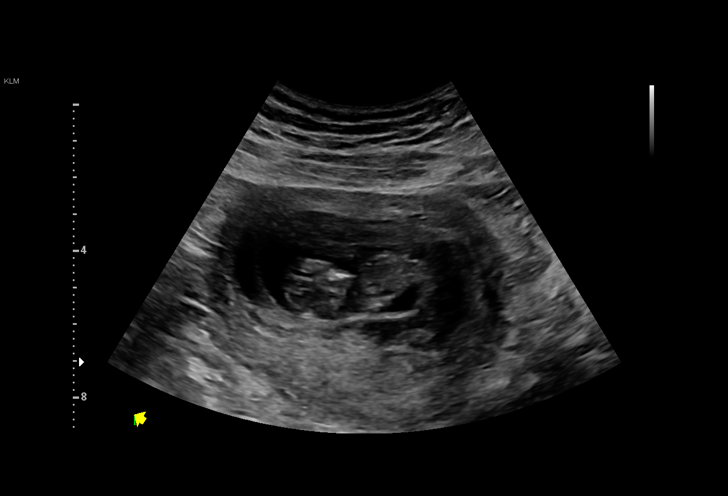
[im 22/40]
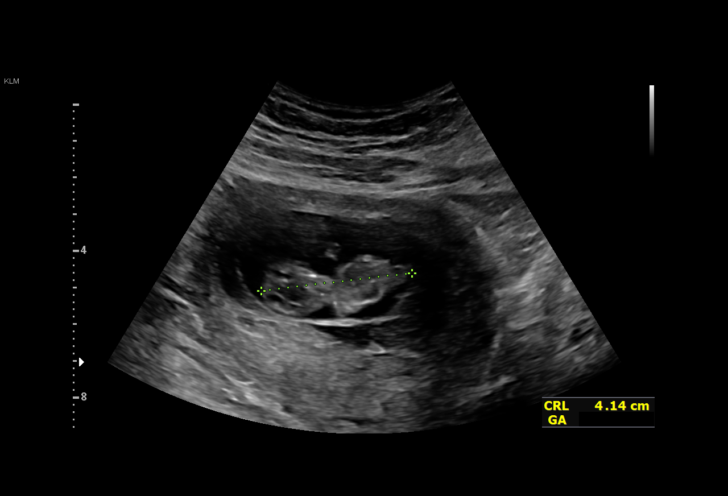
[im 25/40]
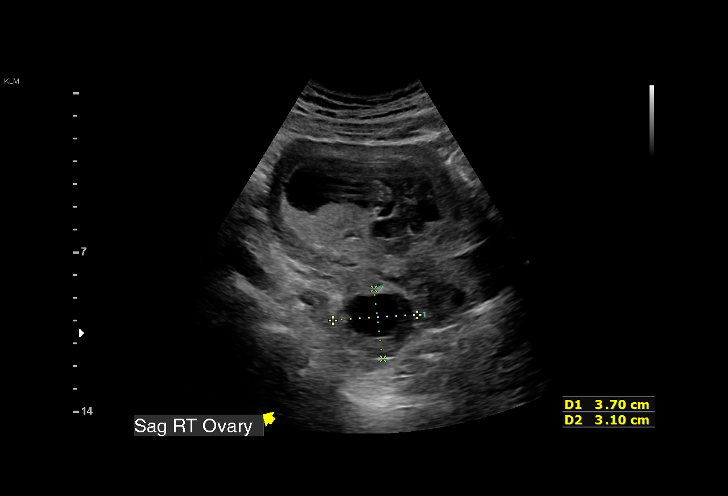
[im 28/40]
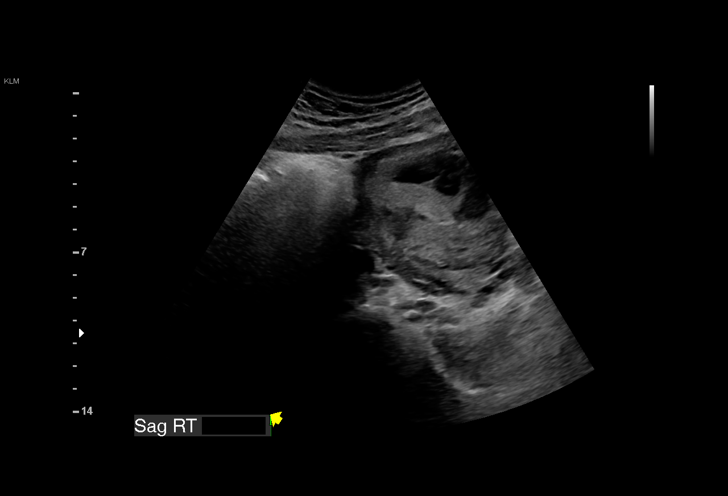
[im 31/40]
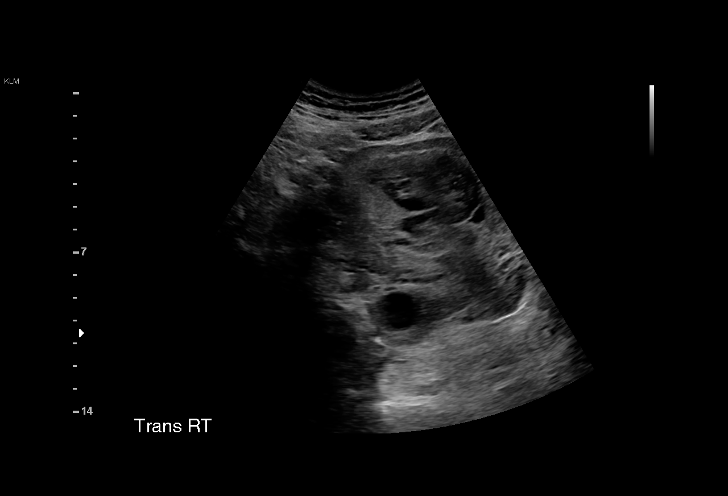
[im 34/40]
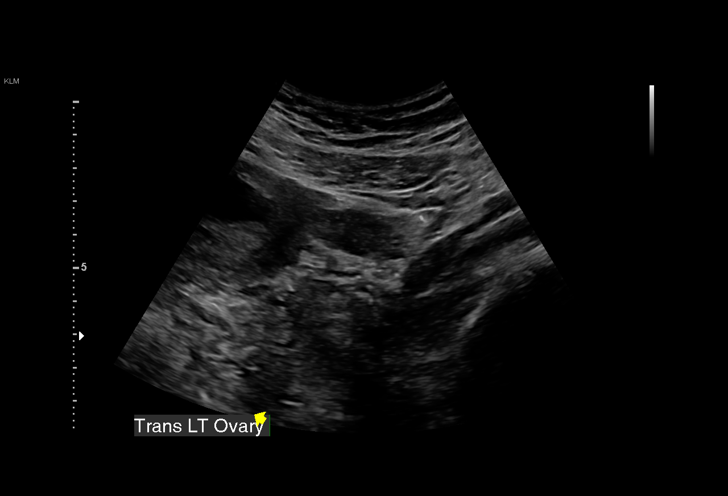
[im 37/40]
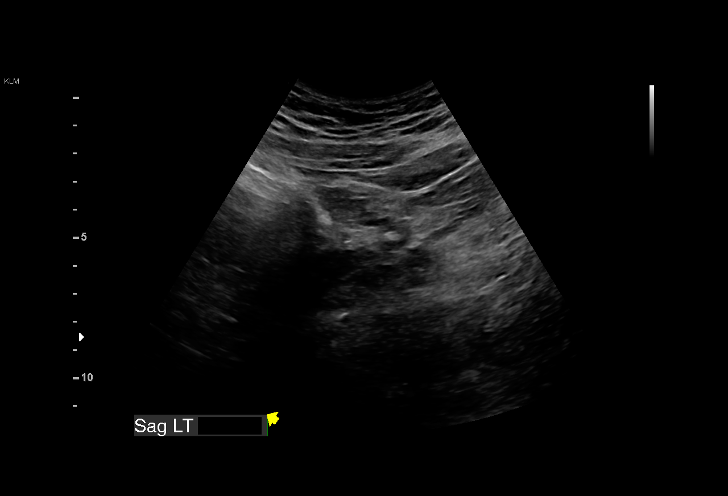
[im 40/40]
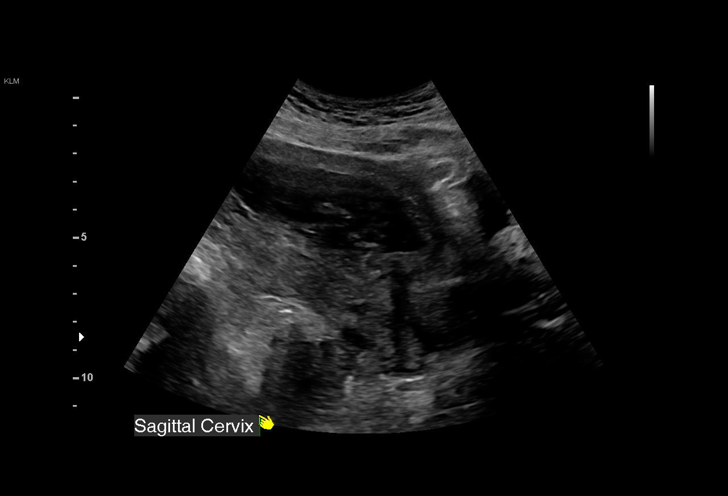

[15 of 28 positions shown; findings below may reference images not displayed]

FINDINGS: Intrauterine gestational sac: Present, single

Yolk sac:  Not Visualized.

Embryo:  Present, single

Cardiac Activity: Present, regular

Heart Rate: 184 bpm

MSD: Appropriate given fetal size

CRL:   41 mm   10 w 6 d                  US EDC: 12/26/2020

Subchorionic hemorrhage: A moderate subchorionic hemorrhage is
identified comprising 25-50% of the chorionic surface.

Maternal uterus/adnexae: The maternal ovaries are unremarkable.
There is no free fluid within the cul-de-sac.
IMPRESSION: Single living intrauterine gestation with an estimated gestational
age of 10 weeks, 6 days.

Moderate subchorionic hemorrhage

## 2022-07-22 ENCOUNTER — Ambulatory Visit
Admission: RE | Admit: 2022-07-22 | Discharge: 2022-07-22 | Disposition: A | Discharge status: Home / Self Care | Payer: 59 | Source: Ambulatory Visit | Attending: Emergency Medicine | Admitting: Emergency Medicine

## 2022-07-22 VITALS — BP 137/81 | HR 96 | Temp 98.0°F | Resp 16

## 2022-07-22 DIAGNOSIS — J029 Acute pharyngitis, unspecified: Secondary | ICD-10-CM | POA: Diagnosis present

## 2022-07-22 DIAGNOSIS — B9689 Other specified bacterial agents as the cause of diseases classified elsewhere: Secondary | ICD-10-CM | POA: Diagnosis present

## 2022-07-22 DIAGNOSIS — J038 Acute tonsillitis due to other specified organisms: Secondary | ICD-10-CM | POA: Diagnosis present

## 2022-07-22 LAB — POCT RAPID STREP A (OFFICE): Rapid Strep A Screen: NEGATIVE

## 2022-07-22 MED ORDER — CEFDINIR 300 MG PO CAPS: 300.0000 mg | ORAL_CAPSULE | Freq: Two times a day (BID) | ORAL | 0 refills | Status: AC

## 2022-07-22 NOTE — ED Provider Notes (Signed)
[name]    CSN: [id number] Arrival date & time: 07/22/22  1046    HISTORY   Chief Complaint  Patient presents with   Sore Throat    Sore throat and tonsils - Entered by patient   Appointment    1100   HPI Isabella Townsend is a pleasant, 27 y.o. female who presents to urgent care today. Pt reports sore throat, pain with swallowing and swollen tonsils x 3 days.  Patient also endorses headache.  Patient denies nausea, vomiting, diarrhea, cough, hoarseness of voice, nasal congestion, rhinorrhea, known sick contacts.  The history is provided by the patient.   History reviewed. No pertinent past medical history. Patient Active Problem List   Diagnosis Date Noted   Normal labor and delivery 12/22/2020   History reviewed. No pertinent surgical history. OB History     Gravida  2   Para  1   Term  1   Preterm      AB      Living  1      SAB      IAB      Ectopic      Multiple      Live Births  1          Home Medications    Prior to Admission medications   Medication Sig Start Date End Date Taking? Authorizing Provider  norethindrone (MICRONOR) 0.35 MG tablet Take 1 tablet by mouth daily. 07/08/22  Yes [provider]  ibuprofen (ADVIL) 600 MG tablet Take 1 tablet (600 mg total) by mouth every 6 (six) hours as needed for cramping or moderate pain. 12/24/20   Essie Hart, MD  Prenatal Vit-Fe Fumarate-FA (PRENATAL COMPLETE) 14-0.4 MG TABS Take 1 tablet by mouth daily. 04/25/20   Wieters, Hallie C, PA-C  senna-docusate (SENOKOT-S) 8.6-50 MG tablet Take 2 tablets by mouth 2 (two) times daily as needed for mild constipation. 12/24/20   Essie Hart, MD  omeprazole (PRILOSEC) 40 MG capsule Take 1 capsule (40 mg total) by mouth daily. Patient not taking: Reported on 10/29/2016 08/10/15 10/14/18  Collene Gobble, MD    Family History History reviewed. No pertinent family history. Social History Social History   Tobacco Use   Smoking status: Never    Smokeless tobacco: Never  Vaping Use   Vaping Use: Never used  Substance Use Topics   Alcohol use: No    Alcohol/week: 0.0 standard drinks of alcohol   Drug use: No   Allergies   Tape  Review of Systems Review of Systems Pertinent findings revealed after performing a 14 point review of systems has been noted in the history of present illness.  Physical Exam Vital Signs BP 137/81 (BP Location: Left Arm)   Pulse 96   Temp 98 F (36.7 C) (Oral)   Resp 16   SpO2 96%   Breastfeeding Yes   No data found.  Physical Exam Constitutional:      General: She is not in acute distress.    Appearance: She is well-developed. She is ill-appearing. She is not toxic-appearing.  HENT:     Head: Normocephalic and atraumatic.     Salivary Glands: Right salivary gland is diffusely enlarged and tender. Left salivary gland is diffusely enlarged and tender.     Right Ear: Hearing and external ear normal.     Left Ear: Hearing and external ear normal.     Ears:     Comments: Bilateral EACs with mild erythema, bilateral TMs  are normal    Nose: No mucosal edema, congestion or rhinorrhea.     Right Turbinates: Not enlarged, swollen or pale.     Left Turbinates: Not enlarged or swollen.     Right Sinus: No maxillary sinus tenderness or frontal sinus tenderness.     Left Sinus: No maxillary sinus tenderness or frontal sinus tenderness.     Mouth/Throat:     Lips: Pink. No lesions.     Mouth: Mucous membranes are moist. No oral lesions or angioedema.     Dentition: No gingival swelling.     Tongue: No lesions.     Palate: No mass.     Pharynx: Uvula midline. Pharyngeal swelling, oropharyngeal exudate and posterior oropharyngeal erythema present. No uvula swelling.     Tonsils: Tonsillar exudate present. 2+ on the right. 2+ on the left.  Eyes:     Extraocular Movements: Extraocular movements intact.     Conjunctiva/sclera: Conjunctivae normal.     Pupils: Pupils are equal, round, and reactive to  light.  Neck:     Thyroid: No thyroid mass, thyromegaly or thyroid tenderness.     Trachea: Tracheal tenderness present. No abnormal tracheal secretions or tracheal deviation.     Comments: Voice is muffled Cardiovascular:     Rate and Rhythm: Normal rate and regular rhythm.     Pulses: Normal pulses.     Heart sounds: Normal heart sounds, S1 normal and S2 normal. No murmur heard.    No friction rub. No gallop.  Pulmonary:     Effort: Pulmonary effort is normal. No accessory muscle usage, prolonged expiration, respiratory distress or retractions.     Breath sounds: No stridor, decreased air movement or transmitted upper airway sounds. No decreased breath sounds, wheezing, rhonchi or rales.  Abdominal:     General: Bowel sounds are normal.     Palpations: Abdomen is soft.     Tenderness: There is generalized abdominal tenderness. There is no right CVA tenderness, left CVA tenderness or rebound. Negative signs include Murphy's sign.     Hernia: No hernia is present.  Musculoskeletal:        General: No tenderness. Normal range of motion.     Cervical back: Full passive range of motion without pain, normal range of motion and neck supple.     Right lower leg: No edema.     Left lower leg: No edema.  Lymphadenopathy:     Cervical: Cervical adenopathy present.     Right cervical: Superficial cervical adenopathy present.     Left cervical: Superficial cervical adenopathy present.  Skin:    General: Skin is warm and dry.     Findings: No erythema, lesion or rash.  Neurological:     General: No focal deficit present.     Mental Status: She is alert and oriented to person, place, and time. Mental status is at baseline.  Psychiatric:        Mood and Affect: Mood normal.        Behavior: Behavior normal.        Thought Content: Thought content normal.        Judgment: Judgment normal.     Visual Acuity Right Eye Distance:   Left Eye Distance:   Bilateral Distance:    Right Eye  Near:   Left Eye Near:    Bilateral Near:     UC Couse / Diagnostics / Procedures:     Radiology No results found.  Procedures Procedures (including critical care  time) EKG  Pending results:  Labs Reviewed  CULTURE, GROUP A STREP [facility])  POCT RAPID STREP A (OFFICE)    Medications Ordered in UC: Medications - No data to display  UC Diagnoses / Final Clinical Impressions(s)   I have reviewed the triage vital signs and the nursing notes.  Pertinent labs & imaging results that were available during my care of the patient were reviewed by me and considered in my medical decision making (see chart for details).    Final diagnoses:  Acute pharyngitis, unspecified etiology  Acute bacterial tonsillitis  Rapid strep test today is negative, throat culture pending.  Based on physical exam findings recommend patient begin antibiotics for empiric treatment of presumed bacterial tonsillitis likely of infectious bacterial etiology other than strep.  Patient advised can take ibuprofen for pain until feeling better.  Conservative care recommended, return precautions advised.      Please see discharge instructions below for further details of plan of care as provided to patient. ED Prescriptions     Medication Sig Dispense Auth. Provider   cefdinir (OMNICEF) 300 MG capsule Take 1 capsule (300 mg total) by mouth 2 (two) times daily for 10 days. 20 capsule Theadora Rama Scales, PA-C      PDMP not reviewed this encounter.  Disposition Upon Discharge:  Condition: stable for discharge home Home: take medications as prescribed; routine discharge instructions as discussed; follow up as advised.  Patient presented with an acute illness with associated systemic symptoms and significant discomfort requiring urgent management. In my opinion, this is a condition that a prudent lay person (someone who possesses an average knowledge of health and medicine) may potentially expect to result in  complications if not addressed urgently such as respiratory distress, impairment of bodily function or dysfunction of bodily organs.   Routine symptom specific, illness specific and/or disease specific instructions were discussed with the patient and/or caregiver at length.   As such, the patient has been evaluated and assessed, work-up was performed and treatment was provided in alignment with urgent care protocols and evidence based medicine.  Patient/parent/caregiver has been advised that the patient may require follow up for further testing and treatment if the symptoms continue in spite of treatment, as clinically indicated and appropriate.  If the patient was tested for COVID-19, Influenza and/or RSV, then the patient/parent/guardian was advised to isolate at home pending the results of his/her diagnostic coronavirus test and potentially longer if they're positive. I have also advised pt that if his/her COVID-19 test returns positive, it's recommended to self-isolate for at least 10 days after symptoms first appeared AND until fever-free for 24 hours without fever reducer AND other symptoms have improved or resolved. Discussed self-isolation recommendations as well as instructions for household member/close contacts as per the [facility] and [place] DHHS, and also gave patient the COVID packet with this information.  Patient/parent/caregiver has been advised to return to the [facility] or PCP in 3-5 days if no better; to PCP or the Emergency Department if new signs and symptoms develop, or if the current signs or symptoms continue to change or worsen for further workup, evaluation and treatment as clinically indicated and appropriate  The patient will follow up with their current PCP if and as advised. If the patient does not currently have a PCP we will assist them in obtaining one.   The patient may need specialty follow up if the symptoms continue, in spite of conservative treatment and management, for further  workup, evaluation, consultation and treatment  as clinically indicated and appropriate.  Patient/parent/caregiver verbalized understanding and agreement of plan as discussed.  All questions were addressed during visit.  Please see discharge instructions below for further details of plan.  Discharge Instructions:   Discharge Instructions      Your strep test today is negative.  Streptococcal throat culture will be performed per our protocol, please keep in mind that the rapid strep test that we perform here at urgent care only catches 40% of strep throat infections.   Based on my physical exam findings and the history you provided to me today, I recommend that you begin antibiotics now for presumed strep throat instead of waiting for the strep culture result.  I have sent a prescription to your pharmacy.  After 24 hours of antibiotics, you should begin to feel significantly better.     If your streptococcal throat culture has a negative result but you feel significantly better after taking antibiotics for 24 to 48 hours, I strongly recommend that you finish the full 10-day course.  Bacterial culture tests are only as reliable as the laboratory technician performing them.     Alternately, if your streptococcal throat culture has a negative result and you see no improvement of your symptoms after 24 to 48 hours of antibiotics, please discontinue the antibiotics as they are no longer indicated.  Your throat infection will then be most likely considered viral and will have to resolve on its own.   After 24 hours of taking antibiotics, you are no longer contagious.  Please discard your toothbrush as well as any other oral devices that you are currently using and replace them with new ones to avoid reinfection.     Please read below to learn more about the medications, dosages and frequencies that I recommend to help alleviate your symptoms and to get you feeling better soon:   Omnicef (cefdinir):   Please take one (1) dose twice daily for 10 days.  This antibiotic can cause upset stomach, this will resolve once antibiotics are complete.  You are welcome to take a probiotic, eat yogurt, take Imodium while taking this medication.  Please avoid other systemic medications such as Maalox, Pepto-Bismol or milk of magnesia as they can interfere with the body's ability to absorb the antibiotics.     Advil, Motrin (ibuprofen): This is a good anti-inflammatory medication which addresses aches, pains and inflammation of the upper airways that causes sinus and nasal congestion as well as in the lower airways which makes your cough feel tight and sometimes burn.  I recommend that you take between 400 to 600 mg every 6-8 hours as needed.  Please do not take more than 2400 mg of ibuprofen in a 24-hour period and please do not take high doses of ibuprofen for more than 3 days in a row as this can lead to stomach ulcers.   If symptoms have not meaningfully improved in the next 7 to 10 days, please return for repeat evaluation or follow-up with your regular provider.  If symptoms have worsened in the next 3 to 5 days, please return for repeat evaluation or follow-up with your regular provider.    Thank you for visiting urgent care today.  We appreciate the opportunity to participate in your care.       This office note has been dictated using Teaching laboratory technician.  Unfortunately, this method of dictation can sometimes lead to typographical or grammatical errors.  I apologize for your inconvenience in advance if  this occurs.  Please do not hesitate to reach out to me if clarification is needed.      Theadora Rama Scales, PA-C 07/23/22 1014

## 2022-07-22 NOTE — ED Triage Notes (Signed)
Pt reports sore throat and swelling tonsils x 3 days. Reports pain when swallow.

## 2022-07-22 NOTE — Discharge Instructions (Signed)
Your strep test today is negative.  Streptococcal throat culture will be performed per our protocol, please keep in mind that the rapid strep test that we perform here at urgent care only catches 40% of strep throat infections.   Based on my physical exam findings and the history you provided to me today, I recommend that you begin antibiotics now for presumed strep throat instead of waiting for the strep culture result.  I have sent a prescription to your pharmacy.  After 24 hours of antibiotics, you should begin to feel significantly better.     If your streptococcal throat culture has a negative result but you feel significantly better after taking antibiotics for 24 to 48 hours, I strongly recommend that you finish the full 10-day course.  Bacterial culture tests are only as reliable as the laboratory technician performing them.     Alternately, if your streptococcal throat culture has a negative result and you see no improvement of your symptoms after 24 to 48 hours of antibiotics, please discontinue the antibiotics as they are no longer indicated.  Your throat infection will then be most likely considered viral and will have to resolve on its own.   After 24 hours of taking antibiotics, you are no longer contagious.  Please discard your toothbrush as well as any other oral devices that you are currently using and replace them with new ones to avoid reinfection.     Please read below to learn more about the medications, dosages and frequencies that I recommend to help alleviate your symptoms and to get you feeling better soon:   Omnicef (cefdinir):  Please take one (1) dose twice daily for 10 days.  This antibiotic can cause upset stomach, this will resolve once antibiotics are complete.  You are welcome to take a probiotic, eat yogurt, take Imodium while taking this medication.  Please avoid other systemic medications such as Maalox, Pepto-Bismol or milk of magnesia as they can interfere with the  body's ability to absorb the antibiotics.     Advil, Motrin (ibuprofen): This is a good anti-inflammatory medication which addresses aches, pains and inflammation of the upper airways that causes sinus and nasal congestion as well as in the lower airways which makes your cough feel tight and sometimes burn.  I recommend that you take between 400 to 600 mg every 6-8 hours as needed.  Please do not take more than 2400 mg of ibuprofen in a 24-hour period and please do not take high doses of ibuprofen for more than 3 days in a row as this can lead to stomach ulcers.   If symptoms have not meaningfully improved in the next 7 to 10 days, please return for repeat evaluation or follow-up with your regular provider.  If symptoms have worsened in the next 3 to 5 days, please return for repeat evaluation or follow-up with your regular provider.    Thank you for visiting urgent care today.  We appreciate the opportunity to participate in your care.

## 2022-07-25 LAB — CULTURE, GROUP A STREP (THRC)

## 2022-08-03 ENCOUNTER — Ambulatory Visit
Admission: RE | Admit: 2022-08-03 | Discharge: 2022-08-03 | Disposition: A | Discharge status: Home / Self Care | Payer: 59 | Source: Ambulatory Visit | Attending: Internal Medicine | Admitting: Internal Medicine

## 2022-08-03 VITALS — BP 144/84 | HR 101 | Temp 98.1°F | Resp 18

## 2022-08-03 DIAGNOSIS — R052 Subacute cough: Secondary | ICD-10-CM

## 2022-08-03 MED ORDER — CETIRIZINE HCL 10 MG PO TABS: 10.0000 mg | ORAL_TABLET | Freq: Every day | ORAL | 0 refills | Status: DC

## 2022-08-03 MED ORDER — BENZONATATE 100 MG PO CAPS: 100.0000 mg | ORAL_CAPSULE | Freq: Three times a day (TID) | ORAL | 0 refills | Status: DC | PRN

## 2022-08-03 NOTE — ED Triage Notes (Signed)
Pt presents with a cough that she stated she had for 2 days now. Pt stated she was unable to get any rest last night.

## 2022-08-03 NOTE — ED Provider Notes (Signed)
[name]    CSN: [id number] Arrival date & time: 08/03/22  1249      History   Chief Complaint Chief Complaint  Patient presents with   Cough    Entered by patient    HPI Isabella Townsend is a 27 y.o. female comes to the urgent care with 2-day history of itchy throat, nasal congestion and nonproductive cough.  Patient says symptoms started 2 days ago and has worsened over the past couple of days.  No shortness of breath or wheezing.  Patient was recently treated for bacterial tonsillitis.  She is completed a course of antibiotics.  She denies any chest pain or chest pressure.  No sick contacts.  No itchy eyes, nose or ears.  No body aches, fever or chills.   HPI  History reviewed. No pertinent past medical history.  Patient Active Problem List   Diagnosis Date Noted   Normal labor and delivery 12/22/2020    History reviewed. No pertinent surgical history.  OB History     Gravida  2   Para  1   Term  1   Preterm      AB      Living  1      SAB      IAB      Ectopic      Multiple      Live Births  1            Home Medications    Prior to Admission medications   Medication Sig Start Date End Date Taking? Authorizing Provider  benzonatate (TESSALON) 100 MG capsule Take 1 capsule (100 mg total) by mouth 3 (three) times daily as needed for cough. 08/03/22  Yes Sindhu Nguyen, Britta Mccreedy, MD  cetirizine (ZYRTEC ALLERGY) 10 MG tablet Take 1 tablet (10 mg total) by mouth daily. 08/03/22  Yes Patrici Minnis, Britta Mccreedy, MD  ibuprofen (ADVIL) 600 MG tablet Take 1 tablet (600 mg total) by mouth every 6 (six) hours as needed for cramping or moderate pain. 12/24/20   Essie Hart, MD  norethindrone (MICRONOR) 0.35 MG tablet Take 1 tablet by mouth daily. 07/08/22   [provider]  Prenatal Vit-Fe Fumarate-FA (PRENATAL COMPLETE) 14-0.4 MG TABS Take 1 tablet by mouth daily. 04/25/20   Wieters, Hallie C, PA-C  senna-docusate (SENOKOT-S) 8.6-50 MG tablet Take 2  tablets by mouth 2 (two) times daily as needed for mild constipation. 12/24/20   Essie Hart, MD  omeprazole (PRILOSEC) 40 MG capsule Take 1 capsule (40 mg total) by mouth daily. Patient not taking: Reported on 10/29/2016 08/10/15 10/14/18  Collene Gobble, MD    Family History History reviewed. No pertinent family history.  Social History Social History   Tobacco Use   Smoking status: Never   Smokeless tobacco: Never  Vaping Use   Vaping Use: Never used  Substance Use Topics   Alcohol use: No    Alcohol/week: 0.0 standard drinks of alcohol   Drug use: No     Allergies   Tape   Review of Systems Review of Systems As per HPI  Physical Exam Triage Vital Signs ED Triage Vitals  Enc Vitals Group     BP 08/03/22 1311 (!) 144/84     Pulse Rate 08/03/22 1311 (!) 101     Resp 08/03/22 1311 18     Temp 08/03/22 1311 98.1 F (36.7 C)     Temp src --      SpO2 08/03/22 1311 93 %  Weight --      Height --      Head Circumference --      Peak Flow --      Pain Score 08/03/22 1310 8     Pain Loc --      Pain Edu? --      Excl. in GC? --    No data found.  Updated Vital Signs BP (!) 144/84   Pulse (!) 101   Temp 98.1 F (36.7 C)   Resp 18   SpO2 93%   Visual Acuity Right Eye Distance:   Left Eye Distance:   Bilateral Distance:    Right Eye Near:   Left Eye Near:    Bilateral Near:     Physical Exam Vitals and nursing note reviewed.  Constitutional:      General: She is not in acute distress.    Appearance: Normal appearance. She is not ill-appearing.  HENT:     Right Ear: Tympanic membrane normal.     Left Ear: Tympanic membrane normal.     Mouth/Throat:     Mouth: Mucous membranes are moist.  Cardiovascular:     Rate and Rhythm: Normal rate and regular rhythm.     Pulses: Normal pulses.     Heart sounds: Normal heart sounds.  Pulmonary:     Effort: Pulmonary effort is normal.     Breath sounds: Normal breath sounds.  Abdominal:     General: Bowel  sounds are normal.     Palpations: Abdomen is soft.  Neurological:     Mental Status: She is alert.      UC Treatments / Results  Labs (all labs ordered are listed, but only abnormal results are displayed) Labs Reviewed - No data to display  EKG   Radiology No results found.  Procedures Procedures (including critical care time)  Medications Ordered in UC Medications - No data to display  Initial Impression / Assessment and Plan / UC Course  I have reviewed the triage vital signs and the nursing notes.  Pertinent labs & imaging results that were available during my care of the patient were reviewed by me and considered in my medical decision making (see chart for details).     1.  Cough: Tessalon Perles as needed for cough No indication for imaging at this time Zyrtec 10 mg orally daily Patient is advised to maintain adequate hydration Return precautions given. Final Clinical Impressions(s) / UC Diagnoses   Final diagnoses:  Subacute cough     Discharge Instructions      Please maintain adequate hydration Please take medications as prescribed No indication for chest x-ray Return to urgent care if symptoms worsen.   ED Prescriptions     Medication Sig Dispense Auth. Provider   benzonatate (TESSALON) 100 MG capsule Take 1 capsule (100 mg total) by mouth 3 (three) times daily as needed for cough. 21 capsule Ebon Ketchum, Britta Mccreedy, MD   cetirizine (ZYRTEC ALLERGY) 10 MG tablet Take 1 tablet (10 mg total) by mouth daily. 30 tablet Marquel Spoto, Britta Mccreedy, MD      PDMP not reviewed this encounter.   Merrilee Jansky, MD 08/03/22 [phone]

## 2022-08-03 NOTE — Discharge Instructions (Addendum)
Please maintain adequate hydration Please take medications as prescribed No indication for chest x-ray Return to urgent care if symptoms worsen.

## 2022-08-21 NOTE — Progress Notes (Signed)
Subjective:    Isabella Townsend - 27 y.o. female MRN [id number]  Date of birth: 28-Sep-1995  HPI  H Duer Spicer is to establish care.   Current issues and/or concerns: - Established with Gynecology.  - Reports recently seen at the Urgent Care for cough and sore throat. Lab returned normal. Medication prescribed helped minimally. Reports since then feels like her tonsils are enlarged but not ready to have removed. She denies associated red flag symptoms. She is aware to follow-up with primary care as scheduled if she would like a referral in the future. Requests to have thyroid checked due to family history of thyroid issues. - No further issues/concerns for discussion today.   ROS per HPI    Health Maintenance:  Health Maintenance Due  Topic Date Due   COVID-19 Vaccine (1) Never done   Hepatitis C Screening  Never done   DTaP/Tdap/Td (1 - Tdap) Never done   PAP-Cervical Cytology Screening  Never done   PAP SMEAR-Modifier  Never done     Past Medical History: Patient Active Problem List   Diagnosis Date Noted   Normal labor and delivery 12/22/2020      Social History   reports that she has never smoked. She has never used smokeless tobacco. She reports that she does not drink alcohol and does not use drugs.   Family History  family history is not on file.   Medications: reviewed and updated   Objective:   Physical Exam BP 137/85 (BP Location: Left Arm, Patient Position: Sitting, Cuff Size: Normal)   Pulse 90   Temp 98.1 F (36.7 C)   Resp 12   Ht 5\' 1"  (1.549 m)   Wt 169 lb 9.6 oz (76.9 kg)   SpO2 99%   Breastfeeding Yes   BMI 32.05 kg/m   Physical Exam HENT:     Head: Normocephalic and atraumatic.     Nose: Nose normal.     Mouth/Throat:     Mouth: Mucous membranes are moist.     Pharynx: Oropharynx is clear.  Eyes:     Extraocular Movements: Extraocular movements intact.     Conjunctiva/sclera: Conjunctivae normal.     Pupils: Pupils are equal, round, and  reactive to light.  Cardiovascular:     Rate and Rhythm: Normal rate and regular rhythm.     Pulses: Normal pulses.     Heart sounds: Normal heart sounds.  Pulmonary:     Effort: Pulmonary effort is normal.     Breath sounds: Normal breath sounds.  Musculoskeletal:     Cervical back: Normal range of motion and neck supple.  Neurological:     General: No focal deficit present.     Mental Status: She is alert and oriented to person, place, and time.  Psychiatric:        Mood and Affect: Mood normal.        Behavior: Behavior normal.         Assessment & Plan:  1. Encounter to establish care - Patient presents today to establish care. During the interim follow-up with primary provider as scheduled.  - Return for annual physical examination, labs, and health maintenance. Arrive fasting meaning having no food for at least 8 hours prior to appointment. You may have only water or black coffee. Please take scheduled medications as normal.  2. Pharyngitis, unspecified etiology 3. Enlarged tonsils - Patient today in office with no cardiopulmonary/acute distress.  - Patient declined pharmacological therapy. - Patient declined referral  to ENT.  - Follow-up with primary provider as scheduled.   4. Thyroid disorder screening - Routine screening.  - TSH  5. Language barrier - Patient speaking English.   Patient was given clear instructions to go to Emergency Department or return to medical center if symptoms don't improve, worsen, or new problems develop.The patient verbalized understanding.  I discussed the assessment and treatment plan with the patient. The patient was provided an opportunity to ask questions and all were answered. The patient agreed with the plan and demonstrated an understanding of the instructions.   The patient was advised to call back or seek an in-person evaluation if the symptoms worsen or if the condition fails to improve as anticipated.    Ricky Stabs,  NP 08/26/2022, 11:52 AM Primary Care at [facility]

## 2022-08-26 ENCOUNTER — Ambulatory Visit: Payer: 59 | Admitting: Family

## 2022-08-26 ENCOUNTER — Encounter: Payer: Self-pay | Admitting: Family

## 2022-08-26 VITALS — BP 137/85 | HR 90 | Temp 98.1°F | Resp 12 | Ht 61.0 in | Wt 169.6 lb

## 2022-08-26 DIAGNOSIS — Z603 Acculturation difficulty: Secondary | ICD-10-CM

## 2022-08-26 DIAGNOSIS — J029 Acute pharyngitis, unspecified: Secondary | ICD-10-CM

## 2022-08-26 DIAGNOSIS — Z7689 Persons encountering health services in other specified circumstances: Secondary | ICD-10-CM

## 2022-08-26 DIAGNOSIS — J351 Hypertrophy of tonsils: Secondary | ICD-10-CM

## 2022-08-26 DIAGNOSIS — Z758 Other problems related to medical facilities and other health care: Secondary | ICD-10-CM

## 2022-08-26 DIAGNOSIS — Z1329 Encounter for screening for other suspected endocrine disorder: Secondary | ICD-10-CM | POA: Diagnosis not present

## 2022-08-26 NOTE — Progress Notes (Signed)
Pt is here to est care  Complaining of sore throat for X2 weeks ago Pt was seen previously at [facility] for same

## 2022-08-26 NOTE — Patient Instructions (Signed)
Thank you for choosing Primary Care at [facility] for your medical home!    Isabella Townsend was seen by Rema Fendt, NP today.   Isabella Townsend's primary care provider is Isabella Stabs, NP   For the best care possible,  you should try to see Isabella Stabs, NP whenever you come to office.   We look forward to seeing you again soon!  If you have any questions about your visit today,  please call us at [phone]  Or feel free to reach your provider via MyChart.   Keeping you healthy   Get these tests Blood pressure- Have your blood pressure checked once a year by your healthcare provider.  Normal blood pressure is 120/80. Weight- Have your body mass index (BMI) calculated to screen for obesity.  BMI is a measure of body fat based on height and weight. You can also calculate your own BMI at https://www.west-esparza.com/. Cholesterol- Have your cholesterol checked regularly starting at age 31, sooner may be necessary if you have diabetes, high blood pressure, if a family member developed heart diseases at an early age or if you smoke.  Chlamydia, HIV, and other sexual transmitted disease- Get screened each year until the age of 65 then within three months of each new sexual partner. Diabetes- Have your blood sugar checked regularly if you have high blood pressure, high cholesterol, a family history of diabetes or if you are overweight.   Get these vaccines Flu shot- Every fall. Tetanus shot- Every 10 years. Menactra- Single dose; prevents meningitis.   Take these steps Don't smoke- If you do smoke, ask your healthcare provider about quitting. For tips on how to quit, go to www.smokefree.gov or call 1-800-QUIT-NOW. Be physically active- Exercise 5 days a week for at least 30 minutes.  If you are not already physically active start slow and gradually work up to 30 minutes of moderate physical activity.  Examples of moderate activity include walking briskly, mowing the yard, dancing, swimming  bicycling, etc. Eat a healthy diet- Eat a variety of healthy foods such as fruits, vegetables, low fat milk, low fat cheese, yogurt, lean meats, poultry, fish, beans, tofu, etc.  For more information on healthy eating, go to www.thenutritionsource.org Drink alcohol in moderation- Limit alcohol intake two drinks or less a day.  Never drink and drive. Dentist- Brush and floss teeth twice daily; visit your dentis twice a year. Depression-Your emotional health is as important as your physical health.  If you're feeling down, losing interest in things you normally enjoy please talk with your healthcare provider. Gun Safety- If you keep a gun in your home, keep it unloaded and with the safety lock on.  Bullets should be stored separately. Helmet use- Always wear a helmet when riding a motorcycle, bicycle, rollerblading or skateboarding. Safe sex- If you may be exposed to a sexually transmitted infection, use a condom Seat belts- Seat bels can save your life; always wear one. Smoke/Carbon Monoxide detectors- These detectors need to be installed on the appropriate level of your home.  Replace batteries at least once a year. Skin Cancer- When out in the sun, cover up and use sunscreen SPF 15 or higher. Violence- If anyone is threatening or hurting you, please tell your healthcare provider.

## 2022-08-27 LAB — TSH: TSH: 1.37 u[IU]/mL (ref 0.450–4.500)

## 2022-10-06 DIAGNOSIS — Z419 Encounter for procedure for purposes other than remedying health state, unspecified: Secondary | ICD-10-CM | POA: Diagnosis not present

## 2022-10-08 ENCOUNTER — Ambulatory Visit (INDEPENDENT_AMBULATORY_CARE_PROVIDER_SITE_OTHER): Payer: 59 | Admitting: Nurse Practitioner

## 2022-10-08 ENCOUNTER — Encounter: Payer: Self-pay | Admitting: Nurse Practitioner

## 2022-10-08 VITALS — BP 110/80 | HR 84 | Ht 61.0 in | Wt 163.0 lb

## 2022-10-08 DIAGNOSIS — Z01419 Encounter for gynecological examination (general) (routine) without abnormal findings: Secondary | ICD-10-CM

## 2022-10-08 DIAGNOSIS — Z3041 Encounter for surveillance of contraceptive pills: Secondary | ICD-10-CM

## 2022-10-08 DIAGNOSIS — G479 Sleep disorder, unspecified: Secondary | ICD-10-CM

## 2022-10-08 MED ORDER — NORETHINDRONE 0.35 MG PO TABS: 1.0000 | ORAL_TABLET | Freq: Every day | ORAL | 3 refills | Status: DC

## 2022-10-08 NOTE — Progress Notes (Signed)
   Isabella Townsend 1996-01-19 [id number]   History:  27 y.o. G2P2002 presents as new patient to establish care. No GYN complaints. Stopped POPs 2 weeks ago because she ran out. Wants to restart. Has not been sexually active since. Amenorrheic on POPs, also breastfeeding. Complains of difficulty falling asleep. Does take a bath prior to sleep but also uses her phone. 2 yo daughter sleeps with her, so she wakes up often due to movement. Melatonin helps. Normal pap history.   Gynecologic History No LMP recorded. (Menstrual status: Oral contraceptives).   Contraception/Family planning: oral progesterone-only contraceptive Sexually active: Yes  Health Maintenance Last Pap: 06/21/2021. Results were: Normal Last mammogram: Not indicated Last colonoscopy: Not indicated  Last Dexa: Not indicated   Past medical history, past surgical history, family history and social history were all reviewed and documented in the EPIC chart. 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 2 yo daughter, 66 yo son.   ROS:  A ROS was performed and pertinent positives and negatives are included.  Exam:  Vitals:   10/08/22 0913  BP: 110/80  Pulse: 84  SpO2: 100%  Weight: 163 lb (73.9 kg)  Height: 5\' 1"  (1.549 m)   Body mass index is 30.8 kg/m.  General appearance:  Normal Thyroid:  Symmetrical, normal in size, without palpable masses or nodularity. Respiratory  Auscultation:  Clear without wheezing or rhonchi Cardiovascular  Auscultation:  Regular rate, without rubs, murmurs or gallops  Edema/varicosities:  Not grossly evident Abdominal  Soft,nontender, without masses, guarding or rebound.  Liver/spleen:  No organomegaly noted  Hernia:  None appreciated  Skin  Inspection:  Grossly normal Breasts: Examined lying and sitting.   Right: Without masses, retractions, nipple discharge or axillary adenopathy.   Left: Without masses, retractions, nipple discharge or axillary adenopathy. Genitourinary   Inguinal/mons:   Normal without inguinal adenopathy  External genitalia:  Normal appearing vulva with no masses, tenderness, or lesions  BUS/Urethra/Skene's glands:  Normal  Vagina:  Normal appearing with normal color and discharge, no lesions  Cervix:  Normal appearing without discharge or lesions  Uterus:  Normal in size, shape and contour.  Midline and mobile, nontender  Adnexa/parametria:     Rt: Normal in size, without masses or tenderness.   Lt: Normal in size, without masses or tenderness.  Anus and perineum: Normal  Digital rectal exam: Deferred  Patient informed chaperone available to be present for breast and pelvic exam. Patient has requested no chaperone to be present. Patient has been advised what will be completed during breast and pelvic exam.   Assessment/Plan:  27 y.o. G2P2002 to establish care.   Well female exam with routine gynecological exam - Education provided on SBEs, importance of preventative screenings, current guidelines, high calcium diet, regular exercise, and multivitamin daily. Labs with PCP.   Encounter for surveillance of contraceptive pills - Plan: norethindrone (MICRONOR) 0.35 MG tablet daily. Ran out 2 weeks ago. Has not been sexually active since. Wants to restart POPs.   Difficulty sleeping - Discussed importance of good sleep hygiene and avoiding TV/phone due to effects of blue light. Magnesium Glycinate along with melatonin.   Screening for cervical cancer - Normal Pap history.  Will repeat at 3-year interval per guidelines.   Return in 1 year for annual.     Olivia Mackie DNP, 9:52 AM 10/08/2022

## 2022-10-30 ENCOUNTER — Encounter: Payer: Self-pay | Admitting: Family

## 2022-10-30 ENCOUNTER — Ambulatory Visit (INDEPENDENT_AMBULATORY_CARE_PROVIDER_SITE_OTHER): Payer: 59 | Admitting: Family

## 2022-10-30 VITALS — BP 123/86 | HR 86 | Temp 98.2°F | Ht 61.0 in | Wt 166.0 lb

## 2022-10-30 DIAGNOSIS — Z131 Encounter for screening for diabetes mellitus: Secondary | ICD-10-CM | POA: Diagnosis not present

## 2022-10-30 DIAGNOSIS — Z130 Encounter for screening for diseases of the blood and blood-forming organs and certain disorders involving the immune mechanism: Secondary | ICD-10-CM | POA: Diagnosis not present

## 2022-10-30 DIAGNOSIS — Z603 Acculturation difficulty: Secondary | ICD-10-CM

## 2022-10-30 DIAGNOSIS — Z1159 Encounter for screening for other viral diseases: Secondary | ICD-10-CM

## 2022-10-30 DIAGNOSIS — Z0000 Encounter for general adult medical examination without abnormal findings: Secondary | ICD-10-CM | POA: Diagnosis not present

## 2022-10-30 DIAGNOSIS — Z1329 Encounter for screening for other suspected endocrine disorder: Secondary | ICD-10-CM | POA: Diagnosis not present

## 2022-10-30 DIAGNOSIS — Z13220 Encounter for screening for lipoid disorders: Secondary | ICD-10-CM | POA: Diagnosis not present

## 2022-10-30 DIAGNOSIS — Z758 Other problems related to medical facilities and other health care: Secondary | ICD-10-CM | POA: Diagnosis not present

## 2022-10-30 DIAGNOSIS — Z13228 Encounter for screening for other metabolic disorders: Secondary | ICD-10-CM | POA: Diagnosis not present

## 2022-10-30 DIAGNOSIS — Z124 Encounter for screening for malignant neoplasm of cervix: Secondary | ICD-10-CM

## 2022-10-30 NOTE — Progress Notes (Signed)
Patient ID: Isabella Townsend, female    DOB: 1995-11-17  MRN: [id number]  CC: Annual Physical Exam  Subjective: Isabella Townsend is a 27 y.o. female who presents for annual physical exam.  Her concerns today include:  - Reports she recently quit breastfeeding and period returned.  - Reports established with Gynecology for women's health maintenance including pap smear.  - No issues/concerns for discussion today.   Patient Active Problem List   Diagnosis Date Noted   Normal labor and delivery 12/22/2020     Current Outpatient Medications on File Prior to Visit  Medication Sig Dispense Refill   MELATONIN PO Take by mouth.     norethindrone (MICRONOR) 0.35 MG tablet Take 1 tablet (0.35 mg total) by mouth daily. 84 tablet 3   senna-docusate (SENOKOT-S) 8.6-50 MG tablet Take 2 tablets by mouth 2 (two) times daily as needed for mild constipation. (Patient not taking: Reported on 10/30/2022) 60 tablet 3   [DISCONTINUED] omeprazole (PRILOSEC) 40 MG capsule Take 1 capsule (40 mg total) by mouth daily. (Patient not taking: Reported on 10/29/2016) 30 capsule 1   No current facility-administered medications on file prior to visit.    Allergies  Allergen Reactions   Tape Other (See Comments)    Social History   Socioeconomic History   Marital status: Married    Spouse name: Not on file   Number of children: Not on file   Years of education: Not on file   Highest education level: Not on file  Occupational History   Not on file  Tobacco Use   Smoking status: Never   Smokeless tobacco: Never  Vaping Use   Vaping status: Never Used  Substance and Sexual Activity   Alcohol use: No    Alcohol/week: 0.0 standard drinks of alcohol   Drug use: No   Sexual activity: Yes    Birth control/protection: Pill  Other Topics Concern   Not on file  Social History Narrative   Not on file   Social Determinants of Health   Financial Resource Strain: Not on file  Food Insecurity: Not on file   Transportation Needs: Not on file  Physical Activity: Not on file  Stress: Not on file  Social Connections: Not on file  Intimate Partner Violence: Not on file    Family History  Problem Relation Age of Onset   Ovarian cancer Neg Hx    Breast cancer Neg Hx     Past Surgical History:  Procedure Laterality Date   CARPAL TUNNEL RELEASE Bilateral 2020    ROS: Review of Systems Negative except as stated above  PHYSICAL EXAM: BP 123/86   Pulse 86   Temp 98.2 F (36.8 C) (Oral)   Ht 5\' 1"  (1.549 m)   Wt 166 lb (75.3 kg)   SpO2 96%   BMI 31.37 kg/m   Physical Exam HENT:     Head: Normocephalic and atraumatic.     Right Ear: Tympanic membrane, ear canal and external ear normal.     Left Ear: Tympanic membrane, ear canal and external ear normal.     Nose: Nose normal.     Mouth/Throat:     Mouth: Mucous membranes are moist.     Pharynx: Oropharynx is clear.  Eyes:     Extraocular Movements: Extraocular movements intact.     Conjunctiva/sclera: Conjunctivae normal.     Pupils: Pupils are equal, round, and reactive to light.  Cardiovascular:     Rate and Rhythm: Normal  rate and regular rhythm.     Pulses: Normal pulses.     Heart sounds: Normal heart sounds.  Pulmonary:     Effort: Pulmonary effort is normal.     Breath sounds: Normal breath sounds.  Chest:     Comments: Patient declined.  Abdominal:     General: Bowel sounds are normal.     Palpations: Abdomen is soft.  Genitourinary:    Comments: Patient declined.  Musculoskeletal:        General: Normal range of motion.     Right shoulder: Normal.     Left shoulder: Normal.     Right upper arm: Normal.     Left upper arm: Normal.     Right elbow: Normal.     Left elbow: Normal.     Right forearm: Normal.     Left forearm: Normal.     Right wrist: Normal.     Left wrist: Normal.     Right hand: Normal.     Left hand: Normal.     Cervical back: Normal, normal range of motion and neck supple.      Thoracic back: Normal.     Lumbar back: Normal.     Right hip: Normal.     Left hip: Normal.     Right upper leg: Normal.     Left upper leg: Normal.     Right knee: Normal.     Left knee: Normal.     Right lower leg: Normal.     Left lower leg: Normal.     Right ankle: Normal.     Left ankle: Normal.     Right foot: Normal.     Left foot: Normal.  Skin:    General: Skin is warm and dry.     Capillary Refill: Capillary refill takes less than 2 seconds.  Neurological:     General: No focal deficit present.     Mental Status: She is alert and oriented to person, place, and time.  Psychiatric:        Mood and Affect: Mood normal.        Behavior: Behavior normal.     ASSESSMENT AND PLAN: 1. Annual physical exam - Counseled on 150 minutes of exercise per week as tolerated, healthy eating (including decreased daily intake of saturated fats, cholesterol, added sugars, sodium), STI prevention, and routine healthcare maintenance.  2. Screening for metabolic disorder - Routine screening.  - CMP14+EGFR  3. Screening for deficiency anemia - Routine screening.  - CBC  4. Diabetes mellitus screening - Routine screening.  - Hemoglobin A1c  5. Screening cholesterol level - Routine screening.  - Lipid panel  6. Thyroid disorder screen - Routine screening.  - TSH  7. Need for hepatitis C screening test - Routine screening.  - Hepatitis C Antibody  8. Cervical cancer screening - Keep all scheduled appointments with Gynecology.   9. Language barrier - Patient speaking English.   Patient was given the opportunity to ask questions.  Patient verbalized understanding of the plan and was able to repeat key elements of the plan. Patient was given clear instructions to go to Emergency Department or return to medical center if symptoms don't improve, worsen, or new problems develop.The patient verbalized understanding.   Orders Placed This Encounter  Procedures   Hepatitis C  Antibody   CBC   Lipid panel   TSH   CMP14+EGFR   Hemoglobin A1c    Return in about 1 year (around 10/30/2023)  for Physical per patient preference.  Rema Fendt, NP

## 2022-10-30 NOTE — Patient Instructions (Signed)

## 2022-10-31 ENCOUNTER — Other Ambulatory Visit: Payer: Self-pay | Admitting: Family

## 2022-10-31 ENCOUNTER — Ambulatory Visit: Payer: Self-pay

## 2022-10-31 DIAGNOSIS — E785 Hyperlipidemia, unspecified: Secondary | ICD-10-CM

## 2022-10-31 MED ORDER — ATORVASTATIN CALCIUM 20 MG PO TABS: 20.0000 mg | ORAL_TABLET | Freq: Every day | ORAL | 0 refills | Status: AC

## 2022-10-31 NOTE — Telephone Encounter (Signed)
Message from [place] sent at 10/31/2022 10:47 AM EDT  Summary: Questions about Rx that was prescribed   Pt stated that she had an appt yesterday and requests call back to discuss Rx that was prescribed. Cb# [phone]        No Estanislado Spire interpreter at [employer]. Called [employer] at [phone]. Was advised to call [employer] at [phone]. Message was left to call back.

## 2022-10-31 NOTE — Telephone Encounter (Signed)
  Chief Complaint: medication Rx- new Rx at pharmacy  Disposition: [] ED /[] Urgent Care (no appt availability in office) / [] Appointment(In office/virtual)/ []  [place] [facility]/ [x] Home Care/ [] Refused Recommended Disposition /[] [place] [facility]/ []  Follow-up with PCP Additional Notes: -Patient notified:  Kidney function normal. - Liver function normal. - Thyroid function normal. - No anemia. - No diabetes. - Hepatitis C non reactive.   The following abnormalities are noted:   - Cholesterol higher than expected. High cholesterol may increase risk of heart attack and/or stroke. Consider eating more fruits, vegetables, and lean baked meats such as chicken or fish. Moderate intensity exercise at least 150 minutes as tolerated per week may help as well.   All other values are normal, stable or within acceptable limits.   Medication changes / Follow up labs / Other changes or recommendations:   - Begin Atorvastatin for high cholesterol. Please call our office to schedule a lab only appointment to recheck fasting cholesterol in 4 to 6 weeks.  Follow up appointment has been scheduled   Reason for Disposition  Caller has medicine question only, adult not sick, AND triager answers question  Answer Assessment - Initial Assessment Questions 1. REASON FOR CALL or QUESTION: "What is your reason for calling today?" or "How can I best help you?" or "What question do you have that I can help answer?"     Patient states she has medication at pharmacy and wants to know what it is for. Lab results reviewed:  - Kidney function normal. - Liver function normal. - Thyroid function normal. - No anemia. - No diabetes. - Hepatitis C non reactive.   The following abnormalities are noted:   - Cholesterol higher than expected. High cholesterol may increase risk of heart attack and/or stroke. Consider eating more fruits, vegetables, and lean baked meats such as chicken or fish. Moderate intensity  exercise at least 150 minutes as tolerated per week may help as well.   All other values are normal, stable or within acceptable limits.   Medication changes / Follow up labs / Other changes or recommendations:   - Begin Atorvastatin for high cholesterol. Please call our office to schedule a lab only appointment to recheck fasting cholesterol in 4 to 6 weeks.  Answer Assessment - Initial Assessment Questions 1. NAME of MEDICINE: "What medicine(s) are you calling about?"     Atorvastatin  2. QUESTION: "What is your question?" (e.g., double dose of medicine, side effect)     Patient states she had visit yesterday and PCP did not discuss new medication- she got alert today from pharmacy she has Rx 3. PRESCRIBER: "Who prescribed the medicine?" Reason: if prescribed by specialist, call should be referred to that group.     PCP See lab notes  Protocols used: Information Only Call - No Triage-A-AH, Medication Question Call-A-AH

## 2022-11-06 DIAGNOSIS — Z419 Encounter for procedure for purposes other than remedying health state, unspecified: Secondary | ICD-10-CM | POA: Diagnosis not present

## 2022-12-01 ENCOUNTER — Other Ambulatory Visit: Payer: 59

## 2022-12-01 DIAGNOSIS — E785 Hyperlipidemia, unspecified: Secondary | ICD-10-CM

## 2022-12-02 LAB — LIPID PANEL
Chol/HDL Ratio: 2.7 ratio (ref 0.0–4.4)
Cholesterol, Total: 112 mg/dL (ref 100–199)
HDL: 42 mg/dL (ref >39)
LDL Chol Calc (NIH): 57 mg/dL (ref 0–99)
Triglycerides: 56 mg/dL (ref 0–149)
VLDL Cholesterol Cal: 13 mg/dL (ref 5–40)

## 2022-12-06 ENCOUNTER — Ambulatory Visit
Admission: EM | Admit: 2022-12-06 | Discharge: 2022-12-06 | Disposition: A | Discharge status: Home / Self Care | Payer: 59 | Source: Home / Self Care

## 2022-12-06 ENCOUNTER — Encounter: Payer: Self-pay | Admitting: Physician Assistant

## 2022-12-06 ENCOUNTER — Other Ambulatory Visit: Payer: Self-pay

## 2022-12-06 DIAGNOSIS — L309 Dermatitis, unspecified: Secondary | ICD-10-CM

## 2022-12-06 MED ORDER — PREDNISONE 20 MG PO TABS: 40.0000 mg | ORAL_TABLET | Freq: Every day | ORAL | 0 refills | Status: AC

## 2022-12-06 NOTE — ED Provider Notes (Signed)
[name]    CSN: [id number] Arrival date & time: 12/06/22  4098      History   Chief Complaint Chief Complaint  Patient presents with   Pruritis    HPI Isabella Townsend is a 27 y.o. female.   Patient here today for evaluation of rash and itching to her back and torso and arms that started 4 days ago.  She states she has tried an allergy medication as well as topical cream without improvement.  She denies any known new exposures or different laundry detergents or body care.  She denies any shortness of breath or trouble swallowing.  The history is provided by the patient.    History reviewed. No pertinent past medical history.  Patient Active Problem List   Diagnosis Date Noted   Hyperlipidemia 10/31/2022   Normal labor and delivery 12/22/2020    Past Surgical History:  Procedure Laterality Date   CARPAL TUNNEL RELEASE Bilateral 2020    OB History     Gravida  2   Para  2   Term  2   Preterm      AB      Living  2      SAB      IAB      Ectopic      Multiple      Live Births  2            Home Medications    Prior to Admission medications   Medication Sig Start Date End Date Taking? Authorizing Provider  predniSONE (DELTASONE) 20 MG tablet Take 2 tablets (40 mg total) by mouth daily with breakfast for 5 days. 12/06/22 12/11/22 Yes Tomi Bamberger, PA-C  atorvastatin (LIPITOR) 20 MG tablet Take 1 tablet (20 mg total) by mouth daily. 10/31/22   Rema Fendt, NP  MELATONIN PO Take by mouth.    [provider]  norethindrone (MICRONOR) 0.35 MG tablet Take 1 tablet (0.35 mg total) by mouth daily. 10/08/22   Wyline Beady A, NP  senna-docusate (SENOKOT-S) 8.6-50 MG tablet Take 2 tablets by mouth 2 (two) times daily as needed for mild constipation. Patient not taking: Reported on 10/30/2022 12/24/20   Essie Hart, MD  omeprazole (PRILOSEC) 40 MG capsule Take 1 capsule (40 mg total) by mouth daily. Patient not taking: Reported  on 10/29/2016 08/10/15 10/14/18  Collene Gobble, MD    Family History Family History  Problem Relation Age of Onset   Ovarian cancer Neg Hx    Breast cancer Neg Hx     Social History Social History   Tobacco Use   Smoking status: Never   Smokeless tobacco: Never  Vaping Use   Vaping status: Never Used  Substance Use Topics   Alcohol use: No    Alcohol/week: 0.0 standard drinks of alcohol   Drug use: No     Allergies   Tape   Review of Systems Review of Systems  Constitutional:  Negative for chills and fever.  HENT:  Negative for trouble swallowing.   Eyes:  Negative for discharge and redness.  Respiratory:  Negative for shortness of breath.   Gastrointestinal:  Negative for abdominal pain, nausea and vomiting.  Skin:  Positive for rash.     Physical Exam Triage Vital Signs ED Triage Vitals  Encounter Vitals Group     BP      Systolic BP Percentile      Diastolic BP Percentile      Pulse  Resp      Temp      Temp src      SpO2      Weight      Height      Head Circumference      Peak Flow      Pain Score      Pain Loc      Pain Education      Exclude from Growth Chart    No data found.  Updated Vital Signs BP 119/84 (BP Location: Right Arm)   Pulse 74   Temp 98 F (36.7 C) (Oral)   Resp 18   SpO2 98%      Physical Exam Vitals and nursing note reviewed.  Constitutional:      General: She is not in acute distress.    Appearance: Normal appearance. She is not ill-appearing.  HENT:     Head: Normocephalic and atraumatic.  Eyes:     Conjunctiva/sclera: Conjunctivae normal.  Cardiovascular:     Rate and Rhythm: Normal rate.  Pulmonary:     Effort: Pulmonary effort is normal. No respiratory distress.  Skin:    Comments: Mildly erythematous papular eruption noted to back in clusters  Neurological:     Mental Status: She is alert.  Psychiatric:        Mood and Affect: Mood normal.        Behavior: Behavior normal.        Thought  Content: Thought content normal.      UC Treatments / Results  Labs (all labs ordered are listed, but only abnormal results are displayed) Labs Reviewed - No data to display  EKG   Radiology No results found.  Procedures Procedures (including critical care time)  Medications Ordered in UC Medications - No data to display  Initial Impression / Assessment and Plan / UC Course  I have reviewed the triage vital signs and the nursing notes.  Pertinent labs & imaging results that were available during my care of the patient were reviewed by me and considered in my medical decision making (see chart for details).    Suspect likely contact dermatitis of some sort.  Will treat with steroid burst and recommended follow-up with PCP.  Encouraged follow-up sooner with any worsening.  Final Clinical Impressions(s) / UC Diagnoses   Final diagnoses:  Dermatitis   Discharge Instructions   None    ED Prescriptions     Medication Sig Dispense Auth. Provider   predniSONE (DELTASONE) 20 MG tablet Take 2 tablets (40 mg total) by mouth daily with breakfast for 5 days. 10 tablet Tomi Bamberger, PA-C      PDMP not reviewed this encounter.   Tomi Bamberger, PA-C 12/06/22 [phone]

## 2022-12-06 NOTE — ED Triage Notes (Signed)
Pt here for itchiness with rash to back, torso and arm x 4 days

## 2022-12-09 ENCOUNTER — Other Ambulatory Visit: Payer: Self-pay | Admitting: Family

## 2022-12-09 DIAGNOSIS — E785 Hyperlipidemia, unspecified: Secondary | ICD-10-CM

## 2023-08-21 ENCOUNTER — Ambulatory Visit
Admission: RE | Admit: 2023-08-21 | Discharge: 2023-08-21 | Disposition: A | Discharge status: Home / Self Care | Payer: Self-pay | Source: Ambulatory Visit | Attending: Family Medicine | Admitting: Family Medicine

## 2023-08-21 VITALS — BP 131/76 | HR 69 | Temp 98.5°F | Resp 16 | Wt 166.0 lb

## 2023-08-21 DIAGNOSIS — L309 Dermatitis, unspecified: Secondary | ICD-10-CM

## 2023-08-21 MED ORDER — METHYLPREDNISOLONE SODIUM SUCC 125 MG IJ SOLR
62.5000 mg | Freq: Once | INTRAMUSCULAR | Status: AC
  Administered 2023-08-21: 62.5 mg via INTRAMUSCULAR

## 2023-08-21 MED ORDER — TRIAMCINOLONE ACETONIDE 0.1 % EX CREA: 1.0000 | TOPICAL_CREAM | Freq: Two times a day (BID) | CUTANEOUS | 0 refills | Status: AC | PRN

## 2023-08-21 MED ORDER — PREDNISONE 20 MG PO TABS: 20.0000 mg | ORAL_TABLET | Freq: Two times a day (BID) | ORAL | 0 refills | Status: AC

## 2023-08-21 MED ORDER — CETIRIZINE HCL 10 MG PO TABS: 10.0000 mg | ORAL_TABLET | Freq: Every day | ORAL | 1 refills | Status: AC

## 2023-08-21 NOTE — Discharge Instructions (Addendum)
 Received Solu-Medrol which is a steroid that will decrease the inflammation that is causing the rash to spread over your body.  I would like for you to take your first dose of prednisone  milligrams tomorrow morning and take twice daily until medication is completed.  I have prescribed triamcinolone  cream apply directly to areas of irritation and rub into the skin completely up to twice daily as needed.  Also prescribed cetirizine  I would like for you to take 1 tablet at bedtime daily this is an allergy medicine and should hopefully control and prevent you from developing these rashes more frequently.

## 2023-08-21 NOTE — ED Triage Notes (Signed)
 Body Itchy - Entered by patient  Pt c/o allergic reaction to something outside. She says she was doing yard work and her body started to break out and itch x more than 2 weeks. Pt says she has tried Benadryl   but it only makes her sleep and the itching returns once she wakes up.

## 2023-08-21 NOTE — ED Provider Notes (Signed)
 [name]    CSN: [id number] Arrival date & time: 08/21/23  1447      History   Chief Complaint Chief Complaint  Patient presents with   Allergic Reaction    Body Itchy - Entered by patient    HPI Isabella Townsend is a 28 y.o. female.   Patient with a history of recurrent dermatitis presents with a generalized body rash that has been worsening over 2 weeks following an several episodes of yard work.  Patient has this occur every spring.  Patient has not had allergy testing to determine the cause of recurrent skin rash exacerbations.  She denies any difficulty breathing.  She has no rashes or irritation in the mouth or throat.  Been taking Benadryl  with minimal relief.  She has worsened over the last couple days and she has been unable to sleep even with taking Benadryl .  History reviewed. No pertinent past medical history.  Patient Active Problem List   Diagnosis Date Noted   Hyperlipidemia 10/31/2022   Normal labor and delivery 12/22/2020    Past Surgical History:  Procedure Laterality Date   CARPAL TUNNEL RELEASE Bilateral 2020    OB History     Gravida  2   Para  2   Term  2   Preterm      AB      Living  2      SAB      IAB      Ectopic      Multiple      Live Births  2            Home Medications    Prior to Admission medications   Medication Sig Start Date End Date Taking? Authorizing Provider  cetirizine  (ZYRTEC ) 10 MG tablet Take 1 tablet (10 mg total) by mouth daily. 08/21/23  Yes Buena Carmine, NP  predniSONE  (DELTASONE ) 20 MG tablet Take 1 tablet (20 mg total) by mouth 2 (two) times daily with a meal for 5 days. 08/22/23 08/27/23 Yes Buena Carmine, NP  triamcinolone  cream (KENALOG ) 0.1 % Apply 1 Application topically 2 (two) times daily as needed (RASH). 08/21/23  Yes Buena Carmine, NP  atorvastatin  (LIPITOR) 20 MG tablet Take 1 tablet (20 mg total) by mouth daily. 10/31/22   Senaida Dama, NP  MELATONIN PO  Take by mouth.    [provider]  norethindrone  (MICRONOR ) 0.35 MG tablet Take 1 tablet (0.35 mg total) by mouth daily. 10/08/22   Antonio Klinefelter A, NP  senna-docusate (SENOKOT-S) 8.6-50 MG tablet Take 2 tablets by mouth 2 (two) times daily as needed for mild constipation. Patient not taking: Reported on 10/30/2022 12/24/20   Pinn, Walda, MD  omeprazole  (PRILOSEC) 40 MG capsule Take 1 capsule (40 mg total) by mouth daily. Patient not taking: Reported on 10/29/2016 08/10/15 10/14/18  Kandy Orris, MD    Family History Family History  Problem Relation Age of Onset   Ovarian cancer Neg Hx    Breast cancer Neg Hx     Social History Social History   Tobacco Use   Smoking status: Never    Passive exposure: Never   Smokeless tobacco: Never  Vaping Use   Vaping status: Never Used  Substance Use Topics   Alcohol use: No    Alcohol/week: 0.0 standard drinks of alcohol   Drug use: No     Allergies   Tape and Wound dressing adhesive   Review of Systems Review  of Systems  Pertinent negatives listed in HPI  Physical Exam Triage Vital Signs ED Triage Vitals  Encounter Vitals Group     BP 08/21/23 1537 131/76     Systolic BP Percentile --      Diastolic BP Percentile --      Pulse Rate 08/21/23 1537 69     Resp 08/21/23 1537 16     Temp 08/21/23 1537 98.5 F (36.9 C)     Temp Source 08/21/23 1537 Oral     SpO2 08/21/23 1537 100 %     Weight 08/21/23 1535 166 lb 0.1 oz (75.3 kg)     Height --      Head Circumference --      Peak Flow --      Pain Score 08/21/23 1534 8     Pain Loc --      Pain Education --      Exclude from Growth Chart --    No data found.  Updated Vital Signs BP 131/76 (BP Location: Left Arm)   Pulse 69   Temp 98.5 F (36.9 C) (Oral)   Resp 16   Wt 166 lb 0.1 oz (75.3 kg)   LMP 08/21/2023 (Exact Date)   SpO2 100%   Breastfeeding No   BMI 31.37 kg/m   Visual Acuity Right Eye Distance:   Left Eye Distance:   Bilateral Distance:     Right Eye Near:   Left Eye Near:    Bilateral Near:     Physical Exam Vitals reviewed.  Constitutional:      Appearance: Normal appearance.  HENT:     Head: Normocephalic and atraumatic.  Eyes:     Extraocular Movements: Extraocular movements intact.     Pupils: Pupils are equal, round, and reactive to light.  Cardiovascular:     Rate and Rhythm: Normal rate and regular rhythm.  Pulmonary:     Effort: Pulmonary effort is normal.     Breath sounds: Normal breath sounds.  Musculoskeletal:        General: Normal range of motion.     Cervical back: Normal range of motion and neck supple.  Skin:    General: Skin is warm.  Neurological:     General: No focal deficit present.     Mental Status: She is alert and oriented to person, place, and time.      UC Treatments / Results  Labs (all labs ordered are listed, but only abnormal results are displayed) Labs Reviewed - No data to display  EKG   Radiology No results found.  Procedures Procedures (including critical care time)  Medications Ordered in UC Medications  methylPREDNISolone  sodium succinate (SOLU-MEDROL ) 125 mg/2 mL injection 62.5 mg (62.5 mg Intramuscular Given 08/21/23 1554)    Initial Impression / Assessment and Plan / UC Course  I have reviewed the triage vital signs and the nursing notes.  Pertinent labs & imaging results that were available during my care of the patient were reviewed by me and considered in my medical decision making (see chart for details).    Dermatitis, current appears to be atopic dermatitis related to outdoor seasonal allergies.  On chart review patient has had this occurred in a similar period of time every year when out side working in the yard.  Treating today with Solu-Medrol  62.5 mg given IM.  Patient will continue treatment with a burst of prednisone  topical triamcinolone  cream as needed.  Advised to discontinue Benadryl  and start cetirizine  10 mg at  bedtime for atopic  dermatitis symptoms.  Return precautions given if symptoms worsen or do not improve. Final Clinical Impressions(s) / UC Diagnoses   Final diagnoses:  Dermatitis     Discharge Instructions      Received Solu-Medrol  which is a steroid that will decrease the inflammation that is causing the rash to spread over your body.  I would like for you to take your first dose of prednisone  milligrams tomorrow morning and take twice daily until medication is completed.  I have prescribed triamcinolone  cream apply directly to areas of irritation and rub into the skin completely up to twice daily as needed.  Also prescribed cetirizine  I would like for you to take 1 tablet at bedtime daily this is an allergy medicine and should hopefully control and prevent you from developing these rashes more frequently.   ED Prescriptions     Medication Sig Dispense Auth. Provider   cetirizine  (ZYRTEC ) 10 MG tablet Take 1 tablet (10 mg total) by mouth daily. 90 tablet Buena Carmine, NP   predniSONE  (DELTASONE ) 20 MG tablet Take 1 tablet (20 mg total) by mouth 2 (two) times daily with a meal for 5 days. 10 tablet Buena Carmine, NP   triamcinolone  cream (KENALOG ) 0.1 % Apply 1 Application topically 2 (two) times daily as needed (RASH). 80 g Buena Carmine, NP      PDMP not reviewed this encounter.   Buena Carmine, NP 08/22/23 [phone]

## 2023-08-23 ENCOUNTER — Other Ambulatory Visit: Payer: Self-pay | Admitting: Nurse Practitioner

## 2023-08-23 DIAGNOSIS — Z3041 Encounter for surveillance of contraceptive pills: Secondary | ICD-10-CM

## 2023-08-24 NOTE — Telephone Encounter (Signed)
 Med refill request: POPs Last AEX: 10/08/2022-TW Next AEX: 10/12/2023-TW Last MMG (if hormonal med): n/a Refill authorized: rx pend.

## 2023-10-12 ENCOUNTER — Ambulatory Visit: Payer: 59 | Admitting: Nurse Practitioner

## 2023-10-16 ENCOUNTER — Ambulatory Visit: Admitting: Nurse Practitioner

## 2023-11-02 ENCOUNTER — Encounter: Payer: 59 | Admitting: Family
